# Patient Record
Sex: Female | Born: 1985 | Race: Black or African American | Hispanic: No | Smoking: Never smoker
Health system: Southern US, Community
[De-identification: ages and names within clinical notes are randomized; demographics above are authoritative.]

## PROBLEM LIST (undated history)

## (undated) DIAGNOSIS — Z87442 Personal history of urinary calculi: Secondary | ICD-10-CM

## (undated) DIAGNOSIS — F419 Anxiety disorder, unspecified: Secondary | ICD-10-CM

## (undated) DIAGNOSIS — J45909 Unspecified asthma, uncomplicated: Secondary | ICD-10-CM

## (undated) DIAGNOSIS — R519 Headache, unspecified: Secondary | ICD-10-CM

## (undated) DIAGNOSIS — F410 Panic disorder [episodic paroxysmal anxiety] without agoraphobia: Secondary | ICD-10-CM

## (undated) DIAGNOSIS — R51 Headache: Secondary | ICD-10-CM

## (undated) DIAGNOSIS — G40909 Epilepsy, unspecified, not intractable, without status epilepticus: Secondary | ICD-10-CM

## (undated) DIAGNOSIS — Z86718 Personal history of other venous thrombosis and embolism: Secondary | ICD-10-CM

## (undated) HISTORY — DX: Epilepsy, unspecified, not intractable, without status epilepticus: G40.909

## (undated) HISTORY — DX: Anxiety disorder, unspecified: F41.9

## (undated) HISTORY — DX: Panic disorder (episodic paroxysmal anxiety): F41.0

## (undated) HISTORY — PX: TONSILLECTOMY: SUR1361

## (undated) HISTORY — PX: APPENDECTOMY: SHX54

---
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
## 2007-04-11 ENCOUNTER — Emergency Department (HOSPITAL_COMMUNITY): Admission: EM | Admit: 2007-04-11 | Discharge: 2007-04-11 | Payer: Self-pay | Admitting: Family Medicine

## 2007-07-11 ENCOUNTER — Inpatient Hospital Stay (HOSPITAL_COMMUNITY): Admission: AD | Admit: 2007-07-11 | Discharge: 2007-07-11 | Payer: Self-pay | Admitting: Obstetrics and Gynecology

## 2008-01-04 ENCOUNTER — Inpatient Hospital Stay (HOSPITAL_COMMUNITY): Admission: AD | Admit: 2008-01-04 | Discharge: 2008-01-24 | Payer: Self-pay | Admitting: Obstetrics and Gynecology

## 2008-01-21 ENCOUNTER — Encounter (INDEPENDENT_AMBULATORY_CARE_PROVIDER_SITE_OTHER): Payer: Self-pay | Admitting: Obstetrics & Gynecology

## 2008-09-13 ENCOUNTER — Emergency Department (HOSPITAL_COMMUNITY): Admission: EM | Admit: 2008-09-13 | Discharge: 2008-09-13 | Payer: Self-pay | Admitting: Emergency Medicine

## 2010-04-04 ENCOUNTER — Observation Stay (HOSPITAL_COMMUNITY)
Admission: EM | Admit: 2010-04-04 | Discharge: 2010-04-04 | Payer: Self-pay | Source: Home / Self Care | Admitting: Emergency Medicine

## 2010-08-08 ENCOUNTER — Emergency Department (HOSPITAL_COMMUNITY)
Admission: EM | Admit: 2010-08-08 | Discharge: 2010-08-08 | Payer: Self-pay | Source: Home / Self Care | Admitting: Family Medicine

## 2010-09-25 LAB — COMPREHENSIVE METABOLIC PANEL
ALT: 8 U/L (ref 0–35)
AST: 16 U/L (ref 0–37)
Albumin: 4.1 g/dL (ref 3.5–5.2)
Alkaline Phosphatase: 30 U/L — ABNORMAL LOW (ref 39–117)
BUN: 9 mg/dL (ref 6–23)
CO2: 23 mEq/L (ref 19–32)
Calcium: 8.7 mg/dL (ref 8.4–10.5)
Chloride: 112 mEq/L (ref 96–112)
Creatinine, Ser: 0.53 mg/dL (ref 0.4–1.2)
GFR calc Af Amer: >60 mL/min (ref >60)
GFR calc non Af Amer: >60 mL/min (ref >60)
Glucose, Bld: 103 mg/dL — ABNORMAL HIGH (ref 70–99)
Potassium: 3.6 mEq/L (ref 3.5–5.1)
Sodium: 139 mEq/L (ref 135–145)
Total Bilirubin: 0.4 mg/dL (ref 0.3–1.2)
Total Protein: 6.7 g/dL (ref 6.0–8.3)

## 2010-09-25 LAB — URINALYSIS, ROUTINE W REFLEX MICROSCOPIC
Bilirubin Urine: NEGATIVE
Glucose, UA: NEGATIVE mg/dL
Hgb urine dipstick: NEGATIVE
Ketones, ur: NEGATIVE mg/dL
Nitrite: NEGATIVE
Protein, ur: NEGATIVE mg/dL
Specific Gravity, Urine: 1.028 (ref 1.005–1.030)
Urobilinogen, UA: 0.2 mg/dL (ref 0.0–1.0)
pH: 6 (ref 5.0–8.0)

## 2010-09-25 LAB — WET PREP, GENITAL
Clue Cells Wet Prep HPF POC: NONE SEEN
Trich, Wet Prep: NONE SEEN
Yeast Wet Prep HPF POC: NONE SEEN

## 2010-09-25 LAB — GC/CHLAMYDIA PROBE AMP, GENITAL
Chlamydia, DNA Probe: NEGATIVE
GC Probe Amp, Genital: NEGATIVE

## 2010-09-25 LAB — CBC
HCT: 34.8 % — ABNORMAL LOW (ref 36.0–46.0)
Hemoglobin: 11.6 g/dL — ABNORMAL LOW (ref 12.0–15.0)
MCH: 27.4 pg (ref 26.0–34.0)
MCHC: 33.4 g/dL (ref 30.0–36.0)
MCV: 82.2 fL (ref 78.0–100.0)
Platelets: 169 10*3/uL (ref 150–400)
RBC: 4.23 MIL/uL (ref 3.87–5.11)
RDW: 13.3 % (ref 11.5–15.5)
WBC: 6.2 10*3/uL (ref 4.0–10.5)

## 2010-09-25 LAB — DIFFERENTIAL
Basophils Absolute: 0 10*3/uL (ref 0.0–0.1)
Basophils Relative: 0 % (ref 0–1)
Eosinophils Absolute: 0 10*3/uL (ref 0.0–0.7)
Eosinophils Relative: 1 % (ref 0–5)
Lymphocytes Relative: 26 % (ref 12–46)
Lymphs Abs: 1.6 10*3/uL (ref 0.7–4.0)
Monocytes Absolute: 0.4 10*3/uL (ref 0.1–1.0)
Monocytes Relative: 7 % (ref 3–12)
Neutro Abs: 4.1 10*3/uL (ref 1.7–7.7)
Neutrophils Relative %: 66 % (ref 43–77)

## 2010-09-25 LAB — POCT PREGNANCY, URINE: Preg Test, Ur: NEGATIVE

## 2010-10-23 LAB — URINALYSIS, ROUTINE W REFLEX MICROSCOPIC
Bilirubin Urine: NEGATIVE
Glucose, UA: NEGATIVE mg/dL
Hgb urine dipstick: NEGATIVE
Ketones, ur: >80 mg/dL — AB
Nitrite: NEGATIVE
Protein, ur: NEGATIVE mg/dL
Specific Gravity, Urine: 1.031 — ABNORMAL HIGH (ref 1.005–1.030)
Urobilinogen, UA: 1 mg/dL (ref 0.0–1.0)
pH: 6 (ref 5.0–8.0)

## 2010-10-23 LAB — POCT PREGNANCY, URINE: Preg Test, Ur: NEGATIVE

## 2010-10-23 LAB — GC/CHLAMYDIA PROBE AMP, GENITAL
Chlamydia, DNA Probe: NEGATIVE
GC Probe Amp, Genital: NEGATIVE

## 2010-10-23 LAB — WET PREP, GENITAL
Clue Cells Wet Prep HPF POC: NONE SEEN
Trich, Wet Prep: NONE SEEN
WBC, Wet Prep HPF POC: NONE SEEN
Yeast Wet Prep HPF POC: NONE SEEN

## 2010-11-25 NOTE — Op Note (Signed)
Candace Stephens, Candace Stephens                 ACCOUNT NO.:  [id number]   MEDICAL RECORD NO.:  [id number]           PATIENT TYPE:   LOCATION:                                 FACILITY:   PHYSICIAN:  Gerrit Friends. Aldona Bar, M.D.   DATE OF BIRTH:  Apr 17, 1986   DATE OF PROCEDURE:  01/21/2008  DATE OF DISCHARGE:                               OPERATIVE REPORT   PREOPERATIVE DIAGNOSES:  1. A 33-1/2 weeks' intrauterine pregnancy.  2. Premature preterm rupture of membranes on January 04, 2008.  3. Labor with probable abruption.   POSTOPERATIVE DIAGNOSES:  1. A 33-1/2 weeks' intrauterine pregnancy.  2. Premature preterm rupture of membranes on January 04, 2008.  3. Labor with probable abruption.  4. Delivery of 4-pound 8-1/2-ounce female infant, Apgars 2, 4, and 7.      Arterial cord pH 7.21.   PROCEDURE:  Primary low transverse cesarean section.   SURGEON:  Gerrit Friends. Aldona Bar, MD   ANESTHESIA:  Epidural - progressed to general endotracheal anesthesia.   HISTORY:  This is a 25 year old female, a primigravida, with a due date  of March 07, 2008, presented at about 42 weeks' gestation with  premature preterm rupture of membranes.  She was admitted, covered with  antibiotics until strep culture returned as negative, received  betamethasone, and was washed expectantly and did very well with normal  white counts and no contractions until January 20, 2008, when she began  having some irregular contractions, which progressed into what was felt  to be early labor.  She was transported to Labor and Delivery on the  evening of January 20, 2008, and as her labor progressed, requested to  receive an epidural.  Fetal tracing was reassuring, although there were  some variable decelerations probably secondary to cord compression  associated with her decreased amniotic fluid volume.  Her cervix on  arrival on Labor and Delivery was about 3 cm with a vertex at about -2  to -3 station and cervix was about 80-90% effaced.  She progressed  to  about 4-5 cm of dilatation at about 5 a.m. on January 21, 2008, with a  vertex remaining at -2 to -3 station.  Fetal heart rate was reassuring.  An IUPC was placed for amnioinfusion if necessary and low-dose Pitocin  was begun to facilitate contractions.  I was called at 6:13 a.m. and  told the patient has started having a large amount of bright red  bleeding, and her cervix was about 5 cm dilated.  Being in the hospital,  I proceeded to her room where at 6:15 on my exam, she was 5 cm dilated  and possibly having an abruption.  She was taken right to the operating  room and prepared for immediate cesarean delivery.   In the operating room with a Foley catheter already inserted and the  epidural already augmented, she was prepped and draped in usual fashion.  It seemed that the epidural was going to be adequate, but upon making a  skin incision, it became obvious that the epidural was not adequate for  anesthesia.  Decision  was made to proceed with general endotracheal  anesthesia, which was carried out without difficulty.   Once the patient was adequately anesthetized, cesarean section was  continued.  The fascia was incised in a low transverse fashion.  The  skin and subcutaneous tissue had already been opened.  Subfascial space  was created inferiorly and superiorly and muscles were separated in the  midline and peritoneum was identified and they were appropriate with  care taken to avoid the bowel superiorly and the bladder inferiorly.  At  this time, the vesicouterine peritoneum was incised in a low transverse  fashion and pushed off the lower uterine segment with ease.  Sharp  incision into the uterus in a low transverse fashion was then carried  out with Metzenbaum scissors and upon entering the uterine cavity, a  large clot was produced.  The incision was extended laterally and  thereafter with minimal difficulty, delivery of a viable female infant was  delivered from a vertex  position.  After the cord was clamped and cut,  the infant was passed off the awaiting team.  Arterial cord pH was  collected and subsequently returned to the 7.21.  Placenta was  delivered.  There was no obvious old abruption, but the placenta was  sent to pathology appropriately.  At this time, the uterus was  exteriorized, rendered free of any remaining products of conception, and  with good uterine contractility afforded.  She was slowly given  intravenous Pitocin and manual stimulation.  The uterine incision was  closed using a single layer of #1 Vicryl in a running locking fashion.  Hemostasis was adequate.  Tubes and ovaries appeared normal.  At this  time, the abdomen lavaged of all free blood clot and with no foreign  bodies noted to be remained in the abdominal cavity, the uterus was  replaced into the abdominal cavity; and after all count was noted to be  correct, closure of the abdomen was carried out in layers.  The  abdominal peritoneum was closed with 0 Vicryl in a running fashion and  muscle was secured with same.  Subfascial space was rendered hemostatic.  The fascia was reapproximated using 0 Vicryl from angle to midline  bilaterally.  Subcutaneous tissue was rendered hemostatic.  Staples were  then used to close the skin.  A sterile pressure dressing was applied,  and the patient was transported to recovery in satisfactory condition  having tolerated the procedure well.   Baby was cared for by Dr. Ruben Gottron.  Baby required intubation and  was transported to the Neonatal Intensive Care Unit.  Apgar were  assigned at 2, 4, and 7.  Infant weighed 4 pounds 8-1/2 ounces.  As  mentioned, arterial cord pH at delivery was 7.21.   In summary, this patient who had preterm premature rupture of membranes  at 31 weeks progressed into early labor and during her labor, was felt  to have an abruption.  She was taken to the operating room for stat  cesarean section delivery and was  delivered a 4-pound 8-1/2-ounce female  infant with Apgars of 2, 4, and 7.  At the conclusion of the procedure,  both mother and baby were stable in their respective recovery areas.      Gerrit Friends. Aldona Bar, M.D.  Electronically Signed     RMW/MEDQ  D:  01/21/2008  T:  01/21/2008  Job:  [id number]

## 2010-11-26 ENCOUNTER — Inpatient Hospital Stay (INDEPENDENT_AMBULATORY_CARE_PROVIDER_SITE_OTHER)
Admission: RE | Admit: 2010-11-26 | Discharge: 2010-11-26 | Disposition: A | Discharge status: Home / Self Care | Payer: 59 | Source: Ambulatory Visit | Attending: Family Medicine | Admitting: Family Medicine

## 2010-11-26 ENCOUNTER — Ambulatory Visit (INDEPENDENT_AMBULATORY_CARE_PROVIDER_SITE_OTHER): Payer: 59

## 2010-11-26 DIAGNOSIS — J40 Bronchitis, not specified as acute or chronic: Secondary | ICD-10-CM

## 2010-11-28 NOTE — Discharge Summary (Signed)
Candace Stephens, SOWLE                 ACCOUNT NO.:  [id number]   MEDICAL RECORD NO.:  [id number]          PATIENT TYPE:  INP   LOCATION:  9306                          FACILITY:  [facility]   PHYSICIAN:  Carrington Clamp, M.D. DATE OF BIRTH:  08-Jan-1986   DATE OF ADMISSION:  01/04/2008  DATE OF DISCHARGE:  01/24/2008                               DISCHARGE SUMMARY   FINAL DIAGNOSES:  Intrauterine pregnancy at 33-1/2 weeks' gestation,  preterm premature rupture of membranes on January 04, 2008, active labor  with probable abruption, delivery of a female infant with Apgars of 2, 4,  and 7.   PROCEDURE:  Primary low transverse cesarean section.   SURGEON:  Gerrit Friends. Aldona Bar, MD.   COMPLICATIONS:  None.   This 25 year old G1, P0, presents about 44 weeks' gestation with preterm  premature rupture of membrane.  The patient was admitted, started on  antibiotics.  Group B strep culture was obtained, which returned  negative.  The patient did receive betamethasone per protocol.  The  patient was continued to be watched closely, and the hospital lab work  remained normal.  The patient did not have any fever, was not having any  contractions until December 21, 2007.  The patient began having some  irregular contractions.  The patient was taken to labor and delivery, as  her labor progressed and epidural was placed.  Fetal heart tracings were  reassuring even though variable decelerations were noted.  The patient  progressed about 4 to 5 cm of dilation and IUPCs were placed.  About  6:13 morning, Dr. Aldona Bar was called.  The patient was having a large  amount of bright red bleeding.  Cervix was only 5 cm dilated.  Dr. Aldona Bar  was already in the hospital.  The possible abruption was diagnosed, and  the patient was taken immediately for cesarean section.  The patient was  taken to the operating room on January 17, 2008, by Dr. Annamaria Helling where a  primary low transverse cesarean section was performed with a delivery of  4  pounds 8-1/2 ounce female infant with Apgars of 2, 4, and 7.  Arterial  cord pH of 7.21 was noted.  The patient and baby were both stable after  delivery.  The patient's postoperative course was benign without any  significant fevers.  The baby was in the NICU and stable.  The patient  was felt ready for discharge on postoperative day #3.  She was sent home  on a regular diet, told to decrease activities, told to continue her  vitamins, was given Percocet 1 to 2 every 4 to 6 hours as needed for her  pain, was to follow up in our office in 4 weeks.  Instructions and  precautions were reviewed with the patient.  Labs on discharge, the  patient had a hemoglobin of 10.4, white blood cell count of 7.1, and  platelets of 129,000.      Leilani Able, P.A.-C.      Carrington Clamp, M.D.  Electronically Signed    MB/MEDQ  D:  02/21/2008  T:  02/22/2008  Job:  [id number]

## 2010-11-28 NOTE — Op Note (Signed)
[place]  Patient:    Candace Stephens, Candace Stephens Visit Number: [id number] MRN: [id number]          Service Type: DSU Location: [facility] Attending Physician:  Lucky Cowboy Dictated by:   Lucky Cowboy, M.D. Proc. Date: 06/24/01 Admit Date:  06/24/2001   CC:         [facility]  Merita Norton, M.D. at [facility]   Operative Report  PREOPERATIVE DIAGNOSES: 1. Obstructive sleep apnea. 2. Chronic tonsillitis.  POSTOPERATIVE DIAGNOSES: 1. Obstructive sleep apnea. 2. Chronic tonsillitis.  PROCEDURE:  Adenotonsillectomy.  SURGEON:  Lucky Cowboy, M.D.  ANESTHESIA:  General endotracheal anesthesia.  ESTIMATED BLOOD LOSS:  30 cc.  SPECIMENS:  Tonsils and adenoids.  COMPLICATIONS:  None.  INDICATIONS FOR PROCEDURE:  This patient is a 25 year old female with weekly sore throats.  In addition, there has been chronic mouth breathing.  The mother reports that she snores very heavily at night with some struggling to breathe.  For these reasons, adenotonsillectomy is performed.  FINDINGS:  The patient was noted to have adenoid hypertrophy filling approximately 3/4 or 75% of the nasopharynx.  This was associated with overall exudate.  There was moderate intranasal edema as well.  Both of the tonsils were 3+ and very cryptic.  There was no active infection of the tonsils.  DESCRIPTION OF PROCEDURE:  The patient was taken to the operating room and placed on the table in the supine position.  She was then placed under general endotracheal anesthesia and the table rotated counterclockwise 90 degrees. The neck was then gently extended using a shoulder roll.  A Crowe-Davis mouth gag with a #3 tongue blade was then placed intraorally, opened and suspended on the Mayo stand.  Palpation of the soft palate was without evidence of a submucosal cleft.  The red rubber catheter was placed down the right nostril, brought out through the oral cavity, and  secured in place with a Hemostat. Inspection of the nasopharynx was then performed.  A medium sized adenoid curette was placed against the vomer and directed inferiorly, thus severing the majority of the adenoid pad.  The remainder was removed using subsequent passes and Thompson-St.Clair forceps.  Three sterile gauze Afrin soaked packs were placed in the nasopharynx, and time allowed for hemostasis.  Palate was relaxed, and both of the palatine tonsils removed using the harmonic scalpel staying in the peritonsillar space adjacent to the tonsillar capsule.  No bleeding was encountered.  Palate was then re-elevated and suction cautery used for hemostasis.  The palate was relaxed, and the nasopharynx copiously irrigated transnasally with normal saline which was suctioned out through the oral cavity.  An NG tube was then placed down the esophagus for suctioning of the gastric contents.  Mouthgag was removed, noted no damage to the teeth or soft tissues.  Table was rotated clockwise 90 degrees to its original position.  The patient was awakened from anesthesia and taken to the post anesthesia care unit in stable condition.  There were no complications.  She will be observed in the recovery care center, and may stay overnight depending on progress.  FOLLOWUP:  Return appointment is with Dr. Gerilyn Pilgrim in 3 weeks. Dictated by:   Lucky Cowboy, M.D. Attending Physician:  Lucky Cowboy DD:  06/24/01 TD:  06/24/01 Job: [id number]

## 2010-12-10 ENCOUNTER — Inpatient Hospital Stay (INDEPENDENT_AMBULATORY_CARE_PROVIDER_SITE_OTHER)
Admission: RE | Admit: 2010-12-10 | Discharge: 2010-12-10 | Disposition: A | Discharge status: Home / Self Care | Payer: 59 | Source: Ambulatory Visit | Attending: Family Medicine | Admitting: Family Medicine

## 2010-12-10 DIAGNOSIS — J310 Chronic rhinitis: Secondary | ICD-10-CM

## 2010-12-10 DIAGNOSIS — R05 Cough: Secondary | ICD-10-CM

## 2010-12-10 DIAGNOSIS — R059 Cough, unspecified: Secondary | ICD-10-CM

## 2011-04-09 LAB — STREP B DNA PROBE: Strep Group B Ag: NEGATIVE

## 2011-04-09 LAB — COMPREHENSIVE METABOLIC PANEL
BUN: 9
CO2: 23
Chloride: 108
Creatinine, Ser: 0.43
GFR calc non Af Amer: >60
Total Bilirubin: 0.5

## 2011-04-09 LAB — CBC
HCT: 32.1 — ABNORMAL LOW
HCT: 31.9 — ABNORMAL LOW
HCT: 32.3 — ABNORMAL LOW
Hemoglobin: 10.7 — ABNORMAL LOW
Hemoglobin: 10.9 — ABNORMAL LOW
Hemoglobin: 10.8 — ABNORMAL LOW
Hemoglobin: 10.9 — ABNORMAL LOW
Hemoglobin: 12.1
MCHC: 33.6
MCHC: 33.8
MCHC: 33.9
MCHC: 33.5
MCHC: 34
MCV: 82.6
MCV: 83.1
MCV: 82
MCV: 82.5
MCV: 83.2
Platelets: 169
Platelets: 170
Platelets: 129 — ABNORMAL LOW
Platelets: 148 — ABNORMAL LOW
Platelets: 163
Platelets: 169
RBC: 3.89
RBC: 4.16
RBC: 3.75 — ABNORMAL LOW
RBC: 3.86 — ABNORMAL LOW
RDW: 13.8
RDW: 13.8
RDW: 14
RDW: 14.2
WBC: 7.1
WBC: 7.3
WBC: 6.2
WBC: 7.1
WBC: 7.4

## 2011-04-09 LAB — RPR: RPR Ser Ql: NONREACTIVE

## 2011-04-17 LAB — WET PREP, GENITAL: Clue Cells Wet Prep HPF POC: NONE SEEN

## 2011-04-17 LAB — POCT PREGNANCY, URINE: Preg Test, Ur: POSITIVE

## 2011-04-17 LAB — URINALYSIS, ROUTINE W REFLEX MICROSCOPIC
Glucose, UA: NEGATIVE
Ketones, ur: NEGATIVE
Nitrite: NEGATIVE
Specific Gravity, Urine: 1.02
pH: 7

## 2011-04-17 LAB — GC/CHLAMYDIA PROBE AMP, GENITAL
Chlamydia, DNA Probe: NEGATIVE
GC Probe Amp, Genital: NEGATIVE

## 2016-02-02 ENCOUNTER — Encounter (HOSPITAL_BASED_OUTPATIENT_CLINIC_OR_DEPARTMENT_OTHER): Payer: Self-pay | Admitting: *Deleted

## 2016-02-02 ENCOUNTER — Emergency Department (HOSPITAL_BASED_OUTPATIENT_CLINIC_OR_DEPARTMENT_OTHER)
Admission: EM | Admit: 2016-02-02 | Discharge: 2016-02-02 | Disposition: A | Discharge status: Home / Self Care | Payer: BC Managed Care – PPO | Attending: Emergency Medicine | Admitting: Emergency Medicine

## 2016-02-02 ENCOUNTER — Emergency Department (HOSPITAL_BASED_OUTPATIENT_CLINIC_OR_DEPARTMENT_OTHER): Payer: BC Managed Care – PPO

## 2016-02-02 DIAGNOSIS — Y92410 Unspecified street and highway as the place of occurrence of the external cause: Secondary | ICD-10-CM | POA: Diagnosis not present

## 2016-02-02 DIAGNOSIS — Z79899 Other long term (current) drug therapy: Secondary | ICD-10-CM | POA: Insufficient documentation

## 2016-02-02 DIAGNOSIS — M25522 Pain in left elbow: Secondary | ICD-10-CM | POA: Diagnosis present

## 2016-02-02 DIAGNOSIS — Y999 Unspecified external cause status: Secondary | ICD-10-CM | POA: Diagnosis not present

## 2016-02-02 DIAGNOSIS — Y9389 Activity, other specified: Secondary | ICD-10-CM | POA: Insufficient documentation

## 2016-02-02 DIAGNOSIS — J45909 Unspecified asthma, uncomplicated: Secondary | ICD-10-CM | POA: Diagnosis not present

## 2016-02-02 DIAGNOSIS — G589 Mononeuropathy, unspecified: Secondary | ICD-10-CM | POA: Diagnosis not present

## 2016-02-02 HISTORY — DX: Unspecified asthma, uncomplicated: J45.909

## 2016-02-02 MED ORDER — GABAPENTIN 100 MG PO CAPS: 100.0000 mg | ORAL_CAPSULE | Freq: Three times a day (TID) | ORAL | 0 refills | Status: DC
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
## 2016-02-12 ENCOUNTER — Encounter: Payer: Self-pay | Admitting: Neurology

## 2016-02-17 DIAGNOSIS — M549 Dorsalgia, unspecified: Secondary | ICD-10-CM | POA: Insufficient documentation

## 2016-02-17 DIAGNOSIS — R29898 Other symptoms and signs involving the musculoskeletal system: Secondary | ICD-10-CM | POA: Insufficient documentation

## 2016-02-25 ENCOUNTER — Ambulatory Visit (INDEPENDENT_AMBULATORY_CARE_PROVIDER_SITE_OTHER): Payer: BC Managed Care – PPO | Admitting: Neurology

## 2016-02-25 ENCOUNTER — Encounter: Payer: Self-pay | Admitting: Neurology

## 2016-02-25 ENCOUNTER — Encounter: Payer: Self-pay | Admitting: *Deleted

## 2016-02-25 VITALS — BP 130/80 | HR 93 | Ht 63.5 in | Wt 136.1 lb

## 2016-02-25 DIAGNOSIS — G5622 Lesion of ulnar nerve, left upper limb: Secondary | ICD-10-CM | POA: Diagnosis not present

## 2016-02-25 DIAGNOSIS — M5410 Radiculopathy, site unspecified: Secondary | ICD-10-CM | POA: Insufficient documentation

## 2016-02-25 MED ORDER — CYCLOBENZAPRINE HCL 5 MG PO TABS: 5.0000 mg | ORAL_TABLET | Freq: Every evening | ORAL | 3 refills | Status: DC | PRN

## 2016-02-25 MED ORDER — GABAPENTIN 300 MG PO CAPS: 300.0000 mg | ORAL_CAPSULE | Freq: Every day | ORAL | 5 refills | Status: DC

## 2016-02-25 NOTE — Patient Instructions (Addendum)
1.  Start physical therapy for right leg stretching 2.  Start gabapentin 300mg  at bedtime 3.  Start flexeril 5mg  at bedtime 4.  Work excuse provided until March 13, 2016  Return to clinic in 3 months
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
## 2016-02-27 ENCOUNTER — Ambulatory Visit: Payer: No Typology Code available for payment source | Attending: Neurology | Admitting: Physical Therapy

## 2016-02-27 DIAGNOSIS — M5441 Lumbago with sciatica, right side: Secondary | ICD-10-CM | POA: Insufficient documentation

## 2016-02-27 DIAGNOSIS — M62830 Muscle spasm of back: Secondary | ICD-10-CM

## 2016-02-27 DIAGNOSIS — R262 Difficulty in walking, not elsewhere classified: Secondary | ICD-10-CM | POA: Diagnosis present

## 2016-02-27 NOTE — Patient Instructions (Signed)
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
## 2016-03-04 ENCOUNTER — Ambulatory Visit: Payer: No Typology Code available for payment source

## 2016-03-04 DIAGNOSIS — M62830 Muscle spasm of back: Secondary | ICD-10-CM

## 2016-03-04 DIAGNOSIS — M5441 Lumbago with sciatica, right side: Secondary | ICD-10-CM | POA: Diagnosis not present

## 2016-03-04 DIAGNOSIS — R262 Difficulty in walking, not elsewhere classified: Secondary | ICD-10-CM

## 2016-03-04 NOTE — Therapy (Signed)
[facility] [address]  [address] [place], [place], [address] Phone: [phone]   Fax:  [phone]  Physical Therapy Treatment  Patient Details  Name: Candace Stephens Fratto MRN: [id number] Date of Birth: 02-Mar-1986 Referring Provider: Glendale Chardonika K Patel, DO  Encounter Date: 03/04/2016      PT End of Session - 03/04/16 0903    Visit Number 2   Number of Visits 12   Date for PT Re-Evaluation 04/09/16   PT Start Time 0845   PT Stop Time 0930   PT Time Calculation (min) 45 min   Activity Tolerance Patient tolerated treatment well;Patient limited by pain   Behavior During Therapy [facility] for tasks assessed/performed      Past Medical History:  Diagnosis Date  . Asthma     Past Surgical History:  Procedure Laterality Date  . APPENDECTOMY    . CESAREAN SECTION    . TONSILLECTOMY      There were no vitals filed for this visit.      Subjective Assessment - 03/04/16 0854    Subjective Pt. reporting the last few days have been very painful making it difficult for her to do much of anything.  Pt. with 5/10 initial R buttocks pain.     Patient Stated Goals "To be able to be back to normal - tolerate standing and walking for longer period of time"   Currently in Pain? Yes   Pain Score 5    Pain Location Back   Pain Orientation Right;Lower   Pain Descriptors / Indicators Crushing;Tingling;Aching;Sharp   Pain Type Acute pain   Pain Radiating Towards pain in R    Pain Onset 1 to 4 weeks ago   Pain Frequency Constant   Multiple Pain Sites No        Today's treatment:  Therex: NuStep: level 4, 5 min  Hooklying pelvic tilt with abdominal bracing 5" x 10 reps  Hooklying bridge (limited ROM) x 10 reps  Manual: R HS, glute, piriformis, SKTC stretch x 30 sec; pt. very guarded with these R hip flexor stretch (in mod thomas position) attempted however terminated due to R buttocks pain L sidelying R glute foam roller; pt. with very  limited tolerance for this; very TTP over TFL  L sidelying R hip flexor stretch x 3 min with strumming to RF; pain increased to 6/10 in this position with significant muscle guarding; pt. only reporting hip flexor stretch at 2 min mark following report of pain in R lower back  Modalities: Moist heat to B hip/buttocks: prone lying with pillow under hips, 10 min   * pt. reports the R hip/buttocks pain at a 1/10 following moist heat          PT Long Term Goals - 03/04/16 0934      PT LONG TERM GOAL #1   Title Indpendent with HEP by 04/09/16   Status On-going     PT LONG TERM GOAL #2   Title Lumbar flexibility WNL w/o LBP or R LE radiculopathy by 04/09/16   Status On-going     PT LONG TERM GOAL #3   Title B hip strength >/= 4+/5 w/o pain by 04/09/16   Status On-going     PT LONG TERM GOAL #4   Title Pt will be able to perform normal household chores and job tasks w/o limitation due to R low back/hip or R LE radicular pain by 04/09/16   Status On-going  Plan - 03/04/16 0933    Clinical Impression Statement Pt. reporting the last few days have been very painful making it difficult for her to do much of anything.  Pt. with 5/10 initial R buttocks pain.  Pt. very guarded throughtout therex with LE stretching and positioning.  Pt. with very limited tolerance for STM to R hip/buttocks today; very TTP over TFL and piriformis.  Pt. able to perform bridge today with limited ROM without large pain increase.  Increased time taken with all hip/LE stretching due to muscular guarding.  Pt. ending treatment with 1/10 R hip pain following application of moist heat in prone.  No HEP activities issued to pt. today due to inability for pt. to perform LE stretching without 8/10 pain and guarding.  Pt. to see PT on 8/25.     PT Treatment/Interventions Patient/family education;Electrical Stimulation;Moist Heat;Ultrasound;Cryotherapy;Traction;Iontophoresis 4mg /ml Dexamethasone;Manual  techniques;Dry needling;Taping;Therapeutic exercise;Neuromuscular re-education;ADLs/Self Care Home Management   PT Next Visit Plan Manual therapy & modalities to reduce pain; Initiate lumbar/proximal LE flexibility stretches as tolerated; Core/LE strengthening as tolerated      Patient will benefit from skilled therapeutic intervention in order to improve the following deficits and impairments:  Pain, Increased muscle spasms, Impaired flexibility, Decreased strength, Decreased activity tolerance, Difficulty walking  Visit Diagnosis: Right-sided low back pain with right-sided sciatica  Muscle spasm of back  Difficulty in walking, not elsewhere classified     Problem List Patient Active Problem List   Diagnosis Date Noted  . Radicular syndrome of right leg 02/25/2016    Kermit BaloMicah Ameen Mostafa , PTA 03/04/2016, 10:39 AM  [facility] [address]  [address] [place], [place], [address] Phone: [phone]   Fax:  [phone]  Name: Candace Stephens Mcmeekin MRN: [id number] Date of Birth: 10-19-1985

## 2016-03-06 ENCOUNTER — Ambulatory Visit: Payer: No Typology Code available for payment source | Admitting: Physical Therapy

## 2016-03-06 ENCOUNTER — Telehealth: Payer: Self-pay | Admitting: Neurology

## 2016-03-06 DIAGNOSIS — M5441 Lumbago with sciatica, right side: Secondary | ICD-10-CM

## 2016-03-06 DIAGNOSIS — M62830 Muscle spasm of back: Secondary | ICD-10-CM

## 2016-03-06 DIAGNOSIS — R262 Difficulty in walking, not elsewhere classified: Secondary | ICD-10-CM

## 2016-03-06 NOTE — Telephone Encounter (Signed)
Patient needs to talk to someone about getting pain medication to help her during the day please call [phone]

## 2016-03-06 NOTE — Telephone Encounter (Signed)
Patient given instructions and agreed with plan.  

## 2016-03-06 NOTE — Telephone Encounter (Signed)
Increase gabapentin in AM to 300 mg from 100mg 

## 2016-03-06 NOTE — Therapy (Signed)
[facility] [address]  [place], [place], [address] Phone: [phone]   Fax:  [phone]  Physical Therapy Treatment  Patient Details  Name: Candace Stephens MRN: [id number] Date of Birth: 02-Sep-1985 Referring Provider: Alda Berthold, DO  Encounter Date: 03/06/2016      PT End of Session - 03/06/16 0848    Visit Number 3   Number of Visits 12   Date for PT Re-Evaluation 04/09/16   PT Start Time 0848   PT Stop Time 0936   PT Time Calculation (min) 48 min   Activity Tolerance Patient tolerated treatment well;Patient limited by pain   Behavior During Therapy [facility] for tasks assessed/performed      Past Medical History:  Diagnosis Date  . Asthma     Past Surgical History:  Procedure Laterality Date  . APPENDECTOMY    . CESAREAN SECTION    . TONSILLECTOMY      There were no vitals filed for this visit.      Subjective Assessment - 03/06/16 0848    Subjective Pt reports pain has been bad for the past few days, intereferring with all activity and limiting sleep. States did purchase TENS unit and has been using it home but limited relief reported.   Patient Stated Goals "To be able to be back to normal - tolerate standing and walking for longer period of time"   Currently in Pain? Yes   Pain Score 8    Pain Location Buttocks   Pain Orientation Right   Pain Descriptors / Indicators Sharp;Shooting;Stabbing   Pain Radiating Towards Throbbing pain down posterior leg to knee           Today's Treatment  Manual STM/MFR to R piriformis in L sidelying with leg supported on bolster (limited tolerance)  SIJ Assessment - reveals apparent R posterior innominate rotation of pelvis on sacrum    Performed MET to correct rotation followed by pelvic shotgun - No cavitation heard/felt, but alignment appeared neutralized after second repetition of MET  Kinesiotape  R piriformis - 2 "I" strips - 30% along muscle  with 50% perpendicular at area of greatest tenderness  Modalities Moist heat to B hip/buttocks -  prone lying with pillow under hips and feet/ankles elevated on bolster to promote muscle relaxation x 10 min  Pt declined estim, reporting having used TENS unit earlier this morning.          PT Education - 03/06/16 0931    Education provided Yes   Education Details SIJ assessment findings - role of MET & kinesiotaping   Person(s) Educated Patient   Methods Explanation;Demonstration   Comprehension Verbalized understanding             PT Long Term Goals - 03/04/16 0934      PT LONG TERM GOAL #1   Title Indpendent with HEP by 04/09/16   Status On-going     PT LONG TERM GOAL #2   Title Lumbar flexibility WNL w/o LBP or R LE radiculopathy by 04/09/16   Status On-going     PT LONG TERM GOAL #3   Title B hip strength >/= 4+/5 w/o pain by 04/09/16   Status On-going     PT LONG TERM GOAL #4   Title Pt will be able to perform normal household chores and job tasks w/o limitation due to R low back/hip or R LE radicular pain by 04/09/16   Status On-going  Plan - 03/06/16 0930    Clinical Impression Statement Pt remains very irritated with high pain levels and limited tolerance for PT or daily activities, and having difficulty sleeping due to unable to find comfortable position. Completed SIJ assessment with apparent R posterior innominate rotation noted. MET performed to correct alignment with no cavitation noted but alignment neutralized after second repetition of MET. Kinesiotape applied to R piriformis to promote muscle inhibition/relaxation and improve circluation and will assess response at next visit. Session completed with moist heat to low back/buttocks. Pt reporting pain reduced to 3-4/10 by end of session.   PT Treatment/Interventions Patient/family education;Electrical Stimulation;Moist Heat;Ultrasound;Cryotherapy;Traction;Iontophoresis 80m/ml  Dexamethasone;Manual techniques;Dry needling;Taping;Therapeutic exercise;Neuromuscular re-education;ADLs/Self Care Home Management   PT Next Visit Plan Assess resposne to taping; Re-assess SIJ as indicated; Manual therapy & modalities to reduce pain; Initiate lumbar/proximal LE flexibility stretches as tolerated; Core/LE strengthening as tolerated   Consulted and Agree with Plan of Care Patient      Patient will benefit from skilled therapeutic intervention in order to improve the following deficits and impairments:  Pain, Increased muscle spasms, Impaired flexibility, Decreased strength, Decreased activity tolerance, Difficulty walking  Visit Diagnosis: Right-sided low back pain with right-sided sciatica  Muscle spasm of back  Difficulty in walking, not elsewhere classified     Problem List Patient Active Problem List   Diagnosis Date Noted  . 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

## 2016-03-06 NOTE — Telephone Encounter (Signed)
Patient is using the flexeril and gabapentin 300 mg qhs and 100 mg qam but she is still having a lot of pain in the mornings and after PT.  Please advise.

## 2016-03-10 ENCOUNTER — Ambulatory Visit: Payer: No Typology Code available for payment source | Admitting: Physical Therapy

## 2016-03-10 DIAGNOSIS — M5441 Lumbago with sciatica, right side: Secondary | ICD-10-CM | POA: Diagnosis not present

## 2016-03-10 DIAGNOSIS — M62830 Muscle spasm of back: Secondary | ICD-10-CM

## 2016-03-10 DIAGNOSIS — R262 Difficulty in walking, not elsewhere classified: Secondary | ICD-10-CM

## 2016-03-10 NOTE — Therapy (Signed)
[facility] [address]  [place], [place], [address] Phone: [phone]   Fax:  [phone]  Physical Therapy Treatment  Patient Details  Name: Candace Stephens MRN: [id number] Date of Birth: [birth year] Referring Provider: Alda Berthold, DO  Encounter Date: 03/10/2016      PT End of Session - 03/10/16 0934    Visit Number 4   Number of Visits 12   Date for PT Re-Evaluation 04/09/16   PT Start Time 0934   PT Stop Time 1016   PT Time Calculation (min) 42 min   Activity Tolerance Patient tolerated treatment well   Behavior During Therapy [facility] for tasks assessed/performed      Past Medical History:  Diagnosis Date  . Asthma     Past Surgical History:  Procedure Laterality Date  . APPENDECTOMY    . CESAREAN SECTION    . TONSILLECTOMY      There were no vitals filed for this visit.      Subjective Assessment - 03/10/16 0934    Subjective Pt reports pain got so bad over the weekend that she went to urgent care and was given an injection which has completely relievede her pain.   Patient Stated Goals "To be able to be back to normal - tolerate standing and walking for longer period of time"   Currently in Pain? No/denies           Today's Treatment  Manual SIJ Assessment - reveals apparent R posterior innominate rotation of pelvis on sacrum    Performed MET to correct rotation followed by pelvic shotgun - No cavitation heard/felt, but alignment appeared neutralized after 1st rep of MET  Manual B HS, ITB, KTOS & figure 4 piriformis stretches x30" each  TherEx B HS &  ITB stretches with strap 2x30" each B KTOS with opposite knee bent vs straight x30" each B figure 4 piriformis stretch 2x30" Mod thomas Hip flexor/RF stretch 2x30" Abd bracing 10x5" TrA + B Hip ABD with green TB 10x5" TrA + Bridge + B Hip ABD isometric with green TB 10x5" (limited lift)          PT Education - 03/10/16 1015     Education provided Yes   Education Details Initial stretching & core stabilization HEP   Person(s) Educated Patient   Methods Explanation;Demonstration;Handout   Comprehension Verbalized understanding;Returned demonstration             PT Long Term Goals - 03/04/16 0934      PT LONG TERM GOAL #1   Title Indpendent with HEP by 04/09/16   Status On-going     PT LONG TERM GOAL #2   Title Lumbar flexibility WNL w/o LBP or R LE radiculopathy by 04/09/16   Status On-going     PT LONG TERM GOAL #3   Title B hip strength >/= 4+/5 w/o pain by 04/09/16   Status On-going     PT LONG TERM GOAL #4   Title Pt will be able to perform normal household chores and job tasks w/o limitation due to R low back/hip or R LE radicular pain by 04/09/16   Status On-going               Plan - 03/10/16 1016    Clinical Impression Statement Pt ended up at urgent care over the weekend due to persistent high pain levels and received injection which has totally relieved her pain at present. Reassessment of  SIJ revealed return of R posterior innominate rotation which corrected easily with initial round of MET. Continued excessive tightness noted in proximal LE musculature R>L along with core weakness, therefore initial stretching and lumbar stabilization HEP created with pt able to perform all exercises w/o pain other than muscle pull. Pt noted muscle twitching while taping in place, but not sure of effect on pain due to effect of injection.   PT Treatment/Interventions Patient/family education;Electrical Stimulation;Moist Heat;Ultrasound;Cryotherapy;Traction;Iontophoresis 47m/ml Dexamethasone;Manual techniques;Dry needling;Taping;Therapeutic exercise;Neuromuscular re-education;ADLs/Self Care Home Management   PT Next Visit Plan Review initial HEP; Re-assess SIJ as indicated; Manual therapy & modalities to reduce pain; Initiate lumbar/proximal LE flexibility stretches as tolerated; Core/LE strengthening as  tolerated   Consulted and Agree with Plan of Care Patient      Patient will benefit from skilled therapeutic intervention in order to improve the following deficits and impairments:  Pain, Increased muscle spasms, Impaired flexibility, Decreased strength, Decreased activity tolerance, Difficulty walking  Visit Diagnosis: Right-sided low back pain with right-sided sciatica  Muscle spasm of back  Difficulty in walking, not elsewhere classified     Problem List Patient Active Problem List   Diagnosis Date Noted  . Radicular syndrome of right leg 02/25/2016    JPercival Spanish PT, MPT 03/10/2016, 12:18 PM  [facility] [place] [address]Phone: [phone]  Fax:  [phone] Name: TMAURIAH MCMILLENMRN: [id number] of Birth: 1Aug 09, 1987

## 2016-03-12 ENCOUNTER — Ambulatory Visit: Payer: No Typology Code available for payment source

## 2016-03-12 DIAGNOSIS — M5441 Lumbago with sciatica, right side: Secondary | ICD-10-CM

## 2016-03-12 DIAGNOSIS — R262 Difficulty in walking, not elsewhere classified: Secondary | ICD-10-CM

## 2016-03-12 DIAGNOSIS — M62830 Muscle spasm of back: Secondary | ICD-10-CM

## 2016-03-12 NOTE — Therapy (Signed)
[facility] [address]  [address] [place], [place], [address] Phone: [phone]   Fax:  [phone]  Physical Therapy Treatment  Patient Details  Name: Candace Stephens MRN: [id number] Date of Birth: 1985-11-18 Referring Provider: Glendale Chardonika K Patel, DO  Encounter Date: 03/12/2016      PT End of Session - 03/12/16 0849    Visit Number 5   Number of Visits 12   Date for PT Re-Evaluation 04/09/16   PT Start Time 0846   PT Stop Time 0928   PT Time Calculation (min) 42 min   Activity Tolerance Patient tolerated treatment well   Behavior [facility] Therapy [facility] for tasks assessed/performed      Past Medical History:  Diagnosis Date  . Asthma     Past Surgical History:  Procedure Laterality Date  . APPENDECTOMY    . CESAREAN SECTION    . TONSILLECTOMY      There were no vitals filed for this visit.      Subjective Assessment - 03/12/16 0848    Subjective Pt. reporting she returns to work tomorrow for a full shift and has had mild pain over the last few days however still with good relief from injection.     Patient Stated Goals "To be able to be back to normal - tolerate standing and walking for longer period of time"   Currently in Pain? No/denies   Pain Score 0-No pain   Multiple Pain Sites No        Today's treatment:  Therex: Recumbent bike: level 1, 4 min   HEP review: B HS stretch with strap x 30 sec each side  B ITB stretch with strap x 30 sec each side  B Supine modified piriformis x 30 sec each side  B Seated piriformis x 30 sec each side  Hooklying B hip abduction/ER with green TB 5" x 10 reps each side Hooklying bridge with sustained B hip abd/ER with green TB 5" x 10 reps Hooklying abdominal bracing 5" x 10 reps B hip flexor stretch x 30 sec each side   Therex: Hooklying B hip abd/ER with green TB x 10 reps          PT Long Term Goals - 03/04/16 0934      PT LONG TERM GOAL #1   Title  Indpendent with HEP by 04/09/16   Status On-going     PT LONG TERM GOAL #2   Title Lumbar flexibility WNL w/o LBP or R LE radiculopathy by 04/09/16   Status On-going     PT LONG TERM GOAL #3   Title B hip strength >/= 4+/5 w/o pain by 04/09/16   Status On-going     PT LONG TERM GOAL #4   Title Pt will be able to perform normal household chores and job tasks w/o limitation due to R low back/hip or R LE radicular pain by 04/09/16   Status On-going               Plan - 03/12/16 0850    Clinical Impression Statement  Pt. reports plan to return to work tomorrow and still with good relief following injection to hip; only mild pain initially today.  Pt. with good recall of HEP and able to demo good technique.  Pt. reporting she will be performing this HEP consistently twice a day.      PT Treatment/Interventions Patient/family education;Electrical Stimulation;Moist Heat;Ultrasound;Cryotherapy;Traction;Iontophoresis 4mg /ml Dexamethasone;Manual techniques;Dry needling;Taping;Therapeutic exercise;Neuromuscular re-education;ADLs/Self Care  Home Management   PT Next Visit Plan Re-assess SIJ as indicated; Manual therapy & modalities to reduce pain; Initiate lumbar/proximal LE flexibility stretches as tolerated; Core/LE strengthening as tolerated      Patient will benefit from skilled therapeutic intervention in order to improve the following deficits and impairments:  Pain, Increased muscle spasms, Impaired flexibility, Decreased strength, Decreased activity tolerance, Difficulty walking  Visit Diagnosis: Right-sided low back pain with right-sided sciatica  Muscle spasm of back  Difficulty in walking, not elsewhere classified     Problem List Patient Active Problem List   Diagnosis Date Noted  . 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  [address] [place], [place], [address] Phone: [phone]   Fax:  [phone]  Name: Candace Stephens MRN: [id number] Date of Birth: May 10, 1986

## 2016-03-17 ENCOUNTER — Ambulatory Visit: Payer: BC Managed Care – PPO | Attending: Neurology | Admitting: Physical Therapy

## 2016-03-17 DIAGNOSIS — M5441 Lumbago with sciatica, right side: Secondary | ICD-10-CM | POA: Diagnosis present

## 2016-03-17 DIAGNOSIS — R262 Difficulty in walking, not elsewhere classified: Secondary | ICD-10-CM | POA: Diagnosis present

## 2016-03-17 DIAGNOSIS — M62830 Muscle spasm of back: Secondary | ICD-10-CM | POA: Insufficient documentation

## 2016-03-17 NOTE — Therapy (Signed)
[facility] [address]  [place], [place], [address] Phone: [phone]   Fax:  [phone]  Physical Therapy Treatment  Patient Details  Name: Candace Stephens MRN: [id number] Date of Birth: [birth year] Referring Provider: Alda Berthold, DO  Encounter Date: 03/17/2016      PT End of Session - 03/17/16 1532    Visit Number 6   Number of Visits 12   Date for PT Re-Evaluation 04/09/16   PT Start Time 1532   PT Stop Time 1617   PT Time Calculation (min) 45 min   Activity Tolerance Patient tolerated treatment well   Behavior During Therapy [facility] for tasks assessed/performed      Past Medical History:  Diagnosis Date  . Asthma     Past Surgical History:  Procedure Laterality Date  . APPENDECTOMY    . CESAREAN SECTION    . TONSILLECTOMY      There were no vitals filed for this visit.      Subjective Assessment - 03/17/16 1532    Subjective Pt reports pain has been worsening over the past few days, limiting exercise and activity tolerance.   Patient Stated Goals "To be able to be back to normal - tolerate standing and walking for longer period of time"   Currently in Pain? Yes   Pain Score 5    Pain Location Back  & buttock   Pain Orientation Right   Pain Descriptors / Indicators Sharp;Stabbing         Today's Treatment  TherEx Rec bike - level 1 x 4'   Manual SIJ Assessment - reveals apparent R posterior innominate rotation of pelvis on sacrum Performed MET to correct rotation followed by pelvic shotgun - No cavitation heard/felt, but alignment appeared neutralized after 1st rep of MET  Manual B HS, ITB, KTOS & figure 4 piriformis stretches x30" each STM/DTM & IASTM/MFR with 4" ball to R piriformis in L sidelying  TherEx TrA + Bridge + B Isometric Hip ABD with belt 10x5" TrA + Bridge + B Isometric Hip ADD with small ball 10x5" 3 way Prayer stretch x30" each Cat/camel  10x5"  Kinesiotape  R piriformis - 2 "I" strips - 30% along muscle with 50% perpendicular at area of greatest tenderness R SIJ - 3 "I" strips in 50% star           PT Education - 03/17/16 1614    Education provided Yes   Education Details Self-release for piriformis using ~ 4" ball    Person(s) Educated Patient   Methods Explanation;Demonstration   Comprehension Verbalized understanding             PT Long Term Goals - 03/04/16 0934      PT LONG TERM GOAL #1   Title Indpendent with HEP by 04/09/16   Status On-going     PT LONG TERM GOAL #2   Title Lumbar flexibility WNL w/o LBP or R LE radiculopathy by 04/09/16   Status On-going     PT LONG TERM GOAL #3   Title B hip strength >/= 4+/5 w/o pain by 04/09/16   Status On-going     PT LONG TERM GOAL #4   Title Pt will be able to perform normal household chores and job tasks w/o limitation due to R low back/hip or R LE radicular pain by 04/09/16   Status On-going  Plan - 03/17/16 1617    Clinical Impression Statement Pt reports she has been back to work but noting pain has worsened over last several days. Reassessment of SIJ revealed return of posterior innominate rotation on L, but corrected with 1 round of MET. Kept exercise focus symmetrical to promote maintenance of neutral alignment and targeted manual therapy to pirformis to decrease muscular pull on SIJ, with kinesiotape applied to promote reduced pain and muscle spasm.   PT Treatment/Interventions Patient/family education;Electrical Stimulation;Moist Heat;Ultrasound;Cryotherapy;Traction;Iontophoresis 51m/ml Dexamethasone;Manual techniques;Dry needling;Taping;Therapeutic exercise;Neuromuscular re-education;ADLs/Self Care Home Management   PT Next Visit Plan Assess respone to taping; Re-assess SIJ as indicated; Manual therapy & modalities to reduce pain; Lumbar/proximal LE flexibility stretches as tolerated; Core/LE strengthening as tolerated    Consulted and Agree with Plan of Care Patient      Patient will benefit from skilled therapeutic intervention in order to improve the following deficits and impairments:  Pain, Increased muscle spasms, Impaired flexibility, Decreased strength, Decreased activity tolerance, Difficulty walking  Visit Diagnosis: Right-sided low back pain with right-sided sciatica  Muscle spasm of back  Difficulty in walking, not elsewhere classified     Problem List Patient Active Problem List   Diagnosis Date Noted  . Radicular syndrome of right leg 02/25/2016    JPercival Spanish PT, MPT 03/17/2016, 6:53 PM  [facility] [place] [address]Phone: [phone]  Fax:  [phone] Name: Candace MCCOWNMRN: [id number] of Birth: [birth year]

## 2016-03-19 ENCOUNTER — Encounter: Payer: Self-pay | Admitting: Rehabilitative and Restorative Service Providers"

## 2016-03-19 ENCOUNTER — Ambulatory Visit: Payer: BC Managed Care – PPO | Admitting: Rehabilitative and Restorative Service Providers"

## 2016-03-19 DIAGNOSIS — M5441 Lumbago with sciatica, right side: Secondary | ICD-10-CM | POA: Diagnosis not present

## 2016-03-19 DIAGNOSIS — M62830 Muscle spasm of back: Secondary | ICD-10-CM

## 2016-03-19 DIAGNOSIS — R262 Difficulty in walking, not elsewhere classified: Secondary | ICD-10-CM

## 2016-03-19 NOTE — Therapy (Signed)
[facility] [address]  [address] [place], [place], [address] Phone: [phone]   Fax:  [phone]  Physical Therapy Treatment  Patient Details  Name: MARVIS BAKKEN MRN: [id number] Date of Birth: 26-Jun-1986 Referring Provider: Glendale Chard, DO  Encounter Date: 03/19/2016      PT End of Session - 03/19/16 1606    Visit Number 7   Number of Visits 12   Date for PT Re-Evaluation 04/09/16   PT Start Time 1531   PT Stop Time 1620   PT Time Calculation (min) 49 min   Activity Tolerance Patient tolerated treatment well      Past Medical History:  Diagnosis Date  . Asthma     Past Surgical History:  Procedure Laterality Date  . APPENDECTOMY    . CESAREAN SECTION    . TONSILLECTOMY      There were no vitals filed for this visit.      Subjective Assessment - 03/19/16 1530    Subjective Pain in the Rt hip when she lies down to rest or when she is getting up in the morning. She is up and odwn on her feet most of the day. She is walking more and with the increased walking she has more hip pain. She is felling better today than she did Tuesday/    Currently in Pain? Yes   Pain Score 3    Pain Location Back   Pain Orientation Right   Pain Descriptors / Indicators Throbbing   Pain Type Acute pain   Pain Onset More than a month ago   Pain Frequency Constant                         [facility] Adult PT Treatment/Exercise - 03/19/16 0001      Self-Care   Self-Care --  education re-sitting posture; use of moist heat s/p TDN     Moist Heat Therapy   Number Minutes Moist Heat 15 Minutes   Moist Heat Location Lumbar Spine;Hip  Rt hip      Manual Therapy   Manual therapy comments pt prone    Soft tissue mobilization bilat lumbar paraspinals; Rt piriformis/glut max/glut med    Myofascial Release lumbar to Rt hip           Trigger Point Dry Needling - 03/19/16 1602    Consent Given? Yes   Education  Handout Provided Yes   Muscles Treated Upper Body --  glut med - decreased muscular tightness to palpation   Gluteus Maximus Response Twitch response elicited;Palpable increased muscle length   Gluteus Minimus Response Twitch response elicited;Palpable increased muscle length   Piriformis Response Twitch response elicited;Palpable increased muscle length              PT Education - 03/19/16 1606    Education provided Yes   Education Details TDN   Person(s) Educated Patient   Methods Explanation   Comprehension Verbalized understanding             PT Long Term Goals - 03/19/16 1610      PT LONG TERM GOAL #1   Title Indpendent with HEP by 04/09/16   Status On-going     PT LONG TERM GOAL #2   Title Lumbar flexibility WNL w/o LBP or R LE radiculopathy by 04/09/16   Status On-going     PT LONG TERM GOAL #3   Title B hip strength >/=  4+/5 w/o pain by 04/09/16   Status On-going     PT LONG TERM GOAL #4   Title Pt will be able to perform normal household chores and job tasks w/o limitation due to R low back/hip or R LE radicular pain by 04/09/16   Status On-going               Plan - 03/19/16 1607    Clinical Impression Statement Improvement reported with therapy and kinesotape. Tolerated TDN well with good release of muscular tightness to palpation. Primary therapist reapplied kinesotape.    Rehab Potential Good   PT Frequency 2x / week   PT Duration 6 weeks   PT Treatment/Interventions Patient/family education;Electrical Stimulation;Moist Heat;Ultrasound;Cryotherapy;Traction;Iontophoresis 4mg /ml Dexamethasone;Manual techniques;Dry needling;Taping;Therapeutic exercise;Neuromuscular re-education;ADLs/Self Care Home Management   PT Next Visit Plan Assess respone to TDN; Re-assess SIJ as indicated; Manual therapy & modalities to reduce pain; Lumbar/proximal LE flexibility stretches as tolerated; Core/LE strengthening as tolerated. continue kinesotaping to decrease pain  and muscular tightness   Consulted and Agree with Plan of Care Patient      Patient will benefit from skilled therapeutic intervention in order to improve the following deficits and impairments:  Pain, Increased muscle spasms, Impaired flexibility, Decreased strength, Decreased activity tolerance, Difficulty walking  Visit Diagnosis: Right-sided low back pain with right-sided sciatica  Muscle spasm of back  Difficulty in walking, not elsewhere classified     Problem List Patient Active Problem List   Diagnosis Date Noted  . Radicular syndrome of right leg 02/25/2016    Jessilynn Taft Rober MinionP Lyndsay Talamante PT, MPH  03/19/2016, 4:11 PM  [facility] [address]  [address] [place], [place], [address] Phone: [phone]   Fax:  [phone]  Name: Delanna Noticeamara N Lansky MRN: [id number] Date of Birth: January 20, 1986

## 2016-03-19 NOTE — Patient Instructions (Signed)

## 2016-03-23 ENCOUNTER — Ambulatory Visit: Payer: BC Managed Care – PPO | Admitting: Neurology

## 2016-03-26 ENCOUNTER — Encounter: Payer: Self-pay | Admitting: Rehabilitative and Restorative Service Providers"

## 2016-03-26 ENCOUNTER — Ambulatory Visit: Payer: BC Managed Care – PPO | Admitting: Rehabilitative and Restorative Service Providers"

## 2016-03-26 DIAGNOSIS — M5441 Lumbago with sciatica, right side: Secondary | ICD-10-CM

## 2016-03-26 DIAGNOSIS — R262 Difficulty in walking, not elsewhere classified: Secondary | ICD-10-CM

## 2016-03-26 DIAGNOSIS — M62830 Muscle spasm of back: Secondary | ICD-10-CM

## 2016-03-26 NOTE — Patient Instructions (Addendum)
Abdominal Bracing With Pelvic Floor (Hook-Lying)    With neutral spine, tighten pelvic floor and abdominals sucking belly button to back bone, tighten muscles in back at waist. Hold 10 sed  Repeat _10__ times. Do _several __ times a day. Progress to do this in sitting; standing; walking and with functional activities    HIP: Hamstrings - Supine   Place strap around foot. Raise leg up, keeping knee straight.  Bend opposite knee to protect back if indicated. Hold 30 seconds. 3 reps per set, 2-3 sets per day     Outer Hip Stretch: Reclined IT Band Stretch (Strap)   Strap around one foot, pull leg across body until you feel a pull or stretch, with shoulders on mat. Hold for 30 seconds. Repeat 3 times each leg. 2-3 times/day.  Piriformis Stretch   Lying on back, pull right knee toward opposite shoulder.  Start with foot resting on floor or bed across the Left leg, then progress to knee toward opposite shoulder in varying angles.  Hold 30 seconds. Repeat 3 times. Do 2-3 sessions per day.   Quads / HF, Supine   Lie near edge of bed, pull both knees up toward chest. Hold one knee as you drop the other leg off the edge of the bed.  Relax hanging knee/can bend knee back if indicated. Hold 30 seconds. Repeat 3 times per session. Do 2-3 sessions per day.    On back hips and knees bent feet apart - drop one knee in and then the other 10-15 reps as needed between exercises - for relaxation

## 2016-03-26 NOTE — Therapy (Signed)
[facility] [address]  [address] [place], [place], [address] Phone: [phone]   Fax:  [phone]  Physical Therapy Treatment  Patient Details  Name: Candace Stephens MRN: [id number] Date of Birth: 01-Jun-1986 Referring Provider: Glendale Chard, DO  Encounter Date: 03/26/2016      PT End of Session - 03/26/16 1537    Visit Number 8   Number of Visits 12   Date for PT Re-Evaluation 04/09/16   PT Start Time 1532   PT Stop Time 1625   PT Time Calculation (min) 53 min   Activity Tolerance Patient tolerated treatment well      Past Medical History:  Diagnosis Date  . Asthma     Past Surgical History:  Procedure Laterality Date  . APPENDECTOMY    . CESAREAN SECTION    . TONSILLECTOMY      There were no vitals filed for this visit.      Subjective Assessment - 03/26/16 1538    Subjective Very very sore following TDN - but by Saturday and Sunday she was pain free. She is now having pain in the the Rt hip and lower back which is worse with sitting in the car. She is sitting in the chairs at work  and can sit 10-15 min  in floor with legs crossed. She has not been able to do that in a while. 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    Currently in Pain? Yes   Pain Score 2    Pain Location Back   Pain Orientation Right   Pain Descriptors / Indicators Aching   Pain Type Acute pain   Pain Radiating Towards lateral hip    Pain Onset More than a month ago   Pain Frequency Intermittent                         [facility] Adult PT Treatment/Exercise - 03/26/16 0001      Lumbar Exercises: Stretches   Passive Hamstring Stretch 3 reps;30 seconds   Hip Flexor Stretch 3 reps;30 seconds   Hip Flexor Stretch Limitations extremely tight - assisted with strap to avoid "throbbing" and get hip flexor stretch   ITB Stretch 3 reps;30 seconds   Piriformis Stretch 3 reps;30  seconds  travell stretch for piriformis    Piriformis Stretch Limitations varying angles opposite knee to chest 30 sec x 4 positions      Lumbar Exercises: Supine   Other Supine Lumbar Exercises alt knee drop for relaxation - pt hooklying - 10-15 between stretching exercises as needed   Other Supine Lumbar Exercises 3 part core 10 sec x 10      Moist Heat Therapy   Number Minutes Moist Heat 15 Minutes   Moist Heat Location Lumbar Spine;Hip  Rt hip      Manual Therapy   Manual therapy comments pt prone    Soft tissue mobilization bilat lumbar paraspinals; Rt piriformis/glut max/glut med    Myofascial Release lumbar to Rt hip    Kinesiotex --  SI tape 3 strips at 50% in some lumbar flexion - star                 PT Education - 03/26/16 1614    Education provided Yes   Education Details HEP    Person(s) Educated Patient   Methods Explanation;Tactile cues;Demonstration;Verbal cues;Handout  Comprehension Verbalized understanding;Returned demonstration;Verbal cues required;Tactile cues required             PT Long Term Goals - 03/26/16 1551      PT LONG TERM GOAL #1   Title Indpendent with HEP by 04/09/16   Status On-going     PT LONG TERM GOAL #2   Title Lumbar flexibility WNL w/o LBP or R LE radiculopathy by 04/09/16   Status On-going     PT LONG TERM GOAL #3   Title B hip strength >/= 4+/5 w/o pain by 04/09/16   Status On-going     PT LONG TERM GOAL #4   Title Pt will be able to perform normal household chores and job tasks w/o limitation due to R low back/hip or R LE radicular pain by 04/09/16   Status On-going               Plan - 03/26/16 1548    Clinical Impression Statement Very sore with TDN but excellent response - with several pain free days and good gains in moblity and function through Rt hip. Improved muscular tightness noted with palpation and maunal work. No radicular Rt LE pain since last week. Progressing well toward stated goals of  therapy.    Rehab Potential Good   PT Frequency 2x / week   PT Duration 6 weeks   PT Treatment/Interventions Patient/family education;Electrical Stimulation;Moist Heat;Ultrasound;Cryotherapy;Traction;Iontophoresis 4mg /ml Dexamethasone;Manual techniques;Dry needling;Taping;Therapeutic exercise;Neuromuscular re-education;ADLs/Self Care Home Management   PT Next Visit Plan Continue TDN as indicated; Re-assess SIJ as indicated; Manual therapy & modalities to reduce pain; Lumbar/proximal LE flexibility stretches as tolerated; Core/LE strengthening as tolerated. continue kinesotaping to decrease pain and muscular tightness   Consulted and Agree with Plan of Care Patient      Patient will benefit from skilled therapeutic intervention in order to improve the following deficits and impairments:  Pain, Increased muscle spasms, Impaired flexibility, Decreased strength, Decreased activity tolerance, Difficulty walking  Visit Diagnosis: Right-sided low back pain with right-sided sciatica  Muscle spasm of back  Difficulty in walking, not elsewhere classified     Problem List Patient Active Problem List   Diagnosis Date Noted  . 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

## 2016-03-30 ENCOUNTER — Ambulatory Visit: Payer: BC Managed Care – PPO | Admitting: Physical Therapy

## 2016-04-02 ENCOUNTER — Ambulatory Visit: Payer: BC Managed Care – PPO | Admitting: Rehabilitative and Restorative Service Providers"

## 2016-04-02 ENCOUNTER — Encounter: Payer: Self-pay | Admitting: Rehabilitative and Restorative Service Providers"

## 2016-04-02 DIAGNOSIS — R262 Difficulty in walking, not elsewhere classified: Secondary | ICD-10-CM

## 2016-04-02 DIAGNOSIS — M5441 Lumbago with sciatica, right side: Secondary | ICD-10-CM

## 2016-04-02 DIAGNOSIS — M62830 Muscle spasm of back: Secondary | ICD-10-CM

## 2016-04-02 NOTE — Patient Instructions (Addendum)
Deep breathing - allowing chest to expand Hold several seconds  Purse lips and blow air out Repeat 5-10 times as tolerated   Extremity Flexion (Hook-Lying)    Tighten stomach and slowly lower right arm over head until back begins to arch. Keep trunk rigid. Repeat __10__ times per set. Do 1-2____ sets per session. Do _1-2___ sessions per day.

## 2016-04-02 NOTE — Therapy (Signed)
[facility] [address]  [address] [place], [place], [address] Phone: [phone]   Fax:  [phone]  Physical Therapy Treatment  Patient Details  Name: Candace Stephens MRN: [id number] Date of Birth: 01/16/1986 Referring Provider: Glendale Chard, DO  Encounter Date: 04/02/2016      PT End of Session - 04/02/16 1612    Visit Number 9   Number of Visits 12   Date for PT Re-Evaluation 04/09/16   PT Start Time 1530   PT Stop Time 1625   PT Time Calculation (min) 55 min   Activity Tolerance Patient tolerated treatment well      Past Medical History:  Diagnosis Date  . Asthma     Past Surgical History:  Procedure Laterality Date  . APPENDECTOMY    . CESAREAN SECTION    . TONSILLECTOMY      There were no vitals filed for this visit.      Subjective Assessment - 04/02/16 1536    Subjective Good weekend and Monday. 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 Then Wednesday lifted a ~30 pound child to move her from the table to the floor to nap. She has pain in the low back and into the Lt upper back into the chest. Feels like her chest "needs to pop". Just took it easy last night and used the TENS unit -some better. Still has pain in the back, Lt shoulder and chest.    Currently in Pain? Yes   Pain Score 5    Pain Location Back   Pain Orientation Mid;Lower   Pain Descriptors / Indicators Aching   Pain Type Acute pain   Pain Onset More than a month ago   Pain Frequency Constant   Aggravating Factors  moving   Pain Relieving Factors rest and meds; TENS             [facility] PT Assessment - 04/02/16 0001      Assessment   Medical Diagnosis R LE radiculopathy   Referring Provider Glendale Chard, DO   Onset Date/Surgical Date 01/27/16   Next MD Visit 06/08/16   Prior Therapy none     Posture/Postural Control   Posture Comments moves stiffly; does not reverse lumbar lodosis; difficulty bending  forward or lat flexioin/rotation of lumbar spine      AROM   Overall AROM Comments limited lumbar flexion with patient unable to move more than 10% flexion; movement guarded in forward flexion or any lateral movement or rotation      Flexibility   Hamstrings Rt 45 deg; Lt 50 deg   Quadriceps mod tightness Rt & mod/severe hip flexor tightness B   ITB mild tightness bilat    Piriformis tight bilat    Quadratus Lumborum mild/mod tight on R     Palpation   Palpation comment tight bilat lumar paraspinals; Rt QL/lats; Rt hip abductors/piriformis                      [facility] Adult PT Treatment/Exercise - 04/02/16 0001      Lumbar Exercises: Stretches   Passive Hamstring Stretch 1 rep;30 seconds  pain in the LB to Lt posterior shd girdle to ant chest w/ Rt   Single Knee to Chest Stretch --  10 reps x 10 sec      Lumbar Exercises: Supine   Other Supine Lumbar Exercises deep breathing to expand chest wall 5-8 sec  hold x 5; alt shd flexion to comfort level x 10 each    Other Supine Lumbar Exercises 3 part core 10 sec x 10      Moist Heat Therapy   Number Minutes Moist Heat 15 Minutes   Moist Heat Location Lumbar Spine;Hip  Rt hip      Cryotherapy   Number Minutes Cryotherapy 15 Minutes   Cryotherapy Location --  sternum   Type of Cryotherapy Ice pack     Manual Therapy   Manual therapy comments pt prone    Soft tissue mobilization bilat lumbar paraspinals; Rt piriformis/glut max/glut med    Myofascial Release bilat lumbar/hip to thoracic scapular area - more Lt than Rt    Kinesiotex --  SI tape 3 strips at 50% in some lumbar flexion - star                 PT Education - 04/02/16 1611    Education provided Yes   Education Details HEP   Person(s) Educated Patient   Methods Explanation;Demonstration;Tactile cues;Verbal cues;Handout   Comprehension Verbalized understanding;Returned demonstration;Verbal cues required;Tactile cues required              PT Long Term Goals - 03/26/16 1551      PT LONG TERM GOAL #1   Title Indpendent with HEP by 04/09/16   Status On-going     PT LONG TERM GOAL #2   Title Lumbar flexibility WNL w/o LBP or R LE radiculopathy by 04/09/16   Status On-going     PT LONG TERM GOAL #3   Title B hip strength >/= 4+/5 w/o pain by 04/09/16   Status On-going     PT LONG TERM GOAL #4   Title Pt will be able to perform normal household chores and job tasks w/o limitation due to R low back/hip or R LE radicular pain by 04/09/16   Status On-going               Plan - 04/02/16 1711    Clinical Impression Statement Patient presents with significant flare up of LBP and onset of Lt upper back to scapular pain radiating into the anterior chest to sternum folling lifting a child at work and attempting to lower child to the floor for her nap. She has significant tightness through the lumbar and thoracic spine; tenderness to palpation Lt sternum and costocondral junctions through upper half to 2/3's of the sternum; guarded movement through upper body and trunk; shallow breath pattern; increased pain. Patient reported some improvement in symtpoms with treatment with focus on gentle movement; deep breathing; UE movement; manual work through the thoracolumbar spine and into the Lt scapular area. Continues to have good response to the taping.     Rehab Potential Good   PT Frequency 2x / week   PT Duration 6 weeks   PT Treatment/Interventions Patient/family education;Electrical Stimulation;Moist Heat;Ultrasound;Cryotherapy;Traction;Iontophoresis 4mg /ml Dexamethasone;Manual techniques;Dry needling;Taping;Therapeutic exercise;Neuromuscular re-education;ADLs/Self Care Home Management   PT Next Visit Plan Continue TDN as indicated; Re-assess SIJ as indicated; Manual therapy & modalities to reduce pain; Lumbar/proximal LE flexibility stretches as tolerated; Core/LE strengthening as tolerated. continue kinesotaping to decrease pain and  muscular tightness.   Consulted and Agree with Plan of Care Patient      Patient will benefit from skilled therapeutic intervention in order to improve the following deficits and impairments:  Pain, Increased muscle spasms, Impaired flexibility, Decreased strength, Decreased activity tolerance, Difficulty walking  Visit Diagnosis: Right-sided low back pain with right-sided sciatica  Muscle spasm of back  Difficulty in walking, not elsewhere classified     Problem List Patient Active Problem List   Diagnosis Date Noted  . Radicular syndrome of right leg 02/25/2016    Vanisha Whiten Rober Minion PT, MPH  04/02/2016, 5:19 PM  [facility] [address]  [address] [place], [place], [address] Phone: [phone]   Fax:  [phone]  Name: Candace Stephens MRN: [id number] Date of Birth: 02-27-1986

## 2016-04-06 ENCOUNTER — Other Ambulatory Visit: Payer: Self-pay | Admitting: *Deleted

## 2016-04-06 ENCOUNTER — Encounter: Payer: Self-pay | Admitting: Neurology

## 2016-04-06 DIAGNOSIS — M5410 Radiculopathy, site unspecified: Secondary | ICD-10-CM

## 2016-04-09 ENCOUNTER — Ambulatory Visit: Payer: BC Managed Care – PPO | Admitting: Physical Therapy

## 2016-04-09 DIAGNOSIS — M5441 Lumbago with sciatica, right side: Secondary | ICD-10-CM | POA: Diagnosis not present

## 2016-04-09 DIAGNOSIS — M62830 Muscle spasm of back: Secondary | ICD-10-CM

## 2016-04-09 DIAGNOSIS — R262 Difficulty in walking, not elsewhere classified: Secondary | ICD-10-CM

## 2016-04-09 NOTE — Therapy (Addendum)
[facility] [place] [address]  [place], [place], [address] Phone: [phone]   Fax:  [phone]  Physical Therapy Treatment  Patient Details  Name: CLAUDEEN LEASON MRN: [id number] Date of Birth: 24-Jan-1986 Referring Provider: Alda Berthold, DO  Encounter Date: 04/09/2016      PT End of Session - 04/09/16 1615    Visit Number 10   Number of Visits 12   Date for PT Re-Evaluation 04/09/16   PT Start Time 1448   PT Stop Time 1656   PT Time Calculation (min) 41 min   Activity Tolerance Patient limited by pain   Behavior During Therapy [facility] for tasks assessed/performed      Past Medical History:  Diagnosis Date  . Asthma     Past Surgical History:  Procedure Laterality Date  . APPENDECTOMY    . CESAREAN SECTION    . TONSILLECTOMY      There were no vitals filed for this visit.      Subjective Assessment - 04/09/16 1617    Subjective Pt reports pain has remained sever since the flare-up last week. Unable to get any significant lasting relief from hot/cold modalities, TENS or meds. Has been scheduled for a MRI next Wednesday.   Patient Stated Goals "To be able to be back to normal - tolerate standing and walking for longer period of time"   Currently in Pain? Yes   Pain Score --  5-6/10   Pain Location Back   Pain Orientation Mid;Lower;Right   Pain Descriptors / Indicators Throbbing   Pain Type Acute pain   Pain Onset More than a month ago   Pain Frequency Constant            [facility] PT Assessment - 04/09/16 1615      Observation/Other Assessments   Focus on Therapeutic Outcomes (FOTO)  Lumbar Spine - 32% (68% limited)          Today's Treatment  Manual STM/MFR to B lumbar paraspinals   SIJ Assessment - reveals apparent R posterior innominate rotation of pelvis on sacrum Performed MET to correct rotation followed by pelvic shotgun - No cavitation heard/felt, but alignment appeared  neutralized after 1st rep of MET  TherEx Cat/camel 5x5"  (limited tolerance) Quadruped Child's pose 2x30' Seated 3 way Prayer stretch with green (65 cm) Pball x30" DKTC with heels on peanut ball 5x10"  Kinesiotape  B thoracolumbar paraspinals - 2 parallel "I" strips - 30% along muscles from mid thoracic spine to distal to SIJ B SIJ - 2 additonal "I" strips in 50% star crossing over vertical paraspinal strips          PT Long Term Goals - 03/26/16 1551      PT LONG TERM GOAL #1   Title Indpendent with HEP by 04/09/16   Status On-going     PT LONG TERM GOAL #2   Title Lumbar flexibility WNL w/o LBP or R LE radiculopathy by 04/09/16   Status On-going     PT LONG TERM GOAL #3   Title B hip strength >/= 4+/5 w/o pain by 04/09/16   Status On-going     PT LONG TERM GOAL #4   Title Pt will be able to perform normal household chores and job tasks w/o limitation due to R low back/hip or R LE radicular pain by 04/09/16   Status On-going               Plan -  04/09/16 1645    Clinical Impression Statement Pt reports pain has remained significantly elevated since flare-up last week with pain now spanning thoracic and lumbar spine. Reassessment of SIJ indicated apparent return of R posterior innominate rotation of the pelvis on sacrum which was corrected with MET. Pt with limited tolerance for gentle movement or manual work today although made good effort during PT session. Pt continues to note benefit from kinesiotaping therefore taping applied to thoracolumbar paraspinals and B SIJ. Pt scheduled to have MRI next Wed 04/15/16 to further evaluate potential cause(s) of current pain.   Rehab Potential --   PT Frequency --   PT Duration --   PT Treatment/Interventions Patient/family education;Electrical Stimulation;Moist Heat;Ultrasound;Cryotherapy;Traction;Iontophoresis 4m/ml Dexamethasone;Manual techniques;Dry needling;Taping;Therapeutic exercise;Neuromuscular re-education;ADLs/Self  Care Home Management   PT Next Visit Plan Continue TDN as indicated; Re-assess SIJ as indicated; Manual therapy & modalities to reduce pain; Lumbar/proximal LE flexibility stretches as tolerated; Core/LE strengthening as tolerated. continue kinesotaping to decrease pain and muscular tightness.   Consulted and Agree with Plan of Care Patient      Patient will benefit from skilled therapeutic intervention in order to improve the following deficits and impairments:  Pain, Increased muscle spasms, Impaired flexibility, Decreased strength, Decreased activity tolerance, Difficulty walking  Visit Diagnosis: Right-sided low back pain with right-sided sciatica  Muscle spasm of back  Difficulty in walking, not elsewhere classified     Problem List Patient Active Problem List   Diagnosis Date Noted  . Radicular syndrome of right leg 02/25/2016    JPercival Spanish PT, MPT 04/09/2016, 7:22 PM  [facility] [place] [address]Phone: [phone]  Fax:  [phone] Name: TJAHAYRA MAZOMRN: [id number] of Birth: 102/18/1987

## 2016-04-13 ENCOUNTER — Ambulatory Visit: Payer: No Typology Code available for payment source | Attending: Neurology | Admitting: Physical Therapy

## 2016-04-13 DIAGNOSIS — M62830 Muscle spasm of back: Secondary | ICD-10-CM | POA: Insufficient documentation

## 2016-04-13 DIAGNOSIS — M546 Pain in thoracic spine: Secondary | ICD-10-CM | POA: Insufficient documentation

## 2016-04-13 DIAGNOSIS — M5441 Lumbago with sciatica, right side: Secondary | ICD-10-CM | POA: Diagnosis not present

## 2016-04-13 DIAGNOSIS — R262 Difficulty in walking, not elsewhere classified: Secondary | ICD-10-CM | POA: Diagnosis present

## 2016-04-13 NOTE — Patient Instructions (Signed)

## 2016-04-13 NOTE — Therapy (Signed)
[facility] [address]  [place], [place], [address] Phone: [phone]   Fax:  [phone]  Physical Therapy Treatment  Patient Details  Name: Candace Stephens MRN: [id number] Date of Birth: 17-Nov-1985 Referring Provider: Alda Berthold, DO  Encounter Date: 04/13/2016      PT End of Session - 04/13/16 1530    Visit Number 11   Number of Visits 24   Date for PT Re-Evaluation 05/29/16   PT Start Time 5809   PT Stop Time 1619   PT Time Calculation (min) 49 min   Activity Tolerance Patient limited by pain   Behavior During Therapy [facility] for tasks assessed/performed      Past Medical History:  Diagnosis Date  . Asthma     Past Surgical History:  Procedure Laterality Date  . APPENDECTOMY    . CESAREAN SECTION    . TONSILLECTOMY      There were no vitals filed for this visit.      Subjective Assessment - 04/13/16 1530    Subjective Pt reports pain is better today and feels that taping from last visit helped.   Patient Stated Goals "To be able to be back to normal - tolerate standing and walking for longer period of time"   Currently in Pain? Yes   Pain Score --  2-3/10   Pain Location Back  + upper thoracic + R hip   Pain Orientation Lower;Upper;Right   Pain Descriptors / Indicators Aching   Pain Type Acute pain   Pain Radiating Towards R hip   Pain Onset More than a month ago   Pain Frequency Constant   Aggravating Factors  moving   Pain Relieving Factors rest, TENS and heat   Effect of Pain on Daily Activities Pain limits all daily tasks and job reponsibilities            [facility] PT Assessment - 04/13/16 1530      Assessment   Medical Diagnosis R LE radiculopathy   Referring Provider Alda Berthold, DO   Onset Date/Surgical Date 01/27/16   Next MD Visit 06/08/16     Prior Function   Level of Independence Independent   Vocation Full time employment  Out of work until 03/13/16 per MD    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, walking, stooping, sitting in small chairs   Leisure Relax - Walk on TM 2-3/month     Observation/Other Assessments   Focus on Therapeutic Outcomes (FOTO)  Lumbar Spine - 32% (68% limited)  as of 04/09/16     AROM   AROM Assessment Site Lumbar   Lumbar Flexion hands to mid shins   Lumbar Extension 25% + increased pain   Lumbar - Right Side Bend hand to just above knee + iincreased pain   Lumbar - Left Side Bend hand to lateral knee - no change in pain   Lumbar - Right Rotation  WFL - no increased pain   Lumbar - Left Rotation  WFL - no increased pain     Strength   Right Hip Flexion 4-/5   Right Hip Extension 4-/5   Right Hip ABduction 4-/5   Right Hip ADduction 4-/5   Left Hip Flexion 4/5   Left Hip Extension 4/5   Left Hip ABduction 4/5   Left Hip ADduction 4/5     Flexibility   Hamstrings Rt 60 deg; Lt 75 deg   Quadriceps mod tightness Rt &  mod/severe hip flexor tightness B   ITB mild tightness bilat    Piriformis moderately tight B   Quadratus Lumborum mild/mod tight on R          Today's Treatment  TherEx Rec bike - level 1 x 5'   Manual SIJ Assessment - reveals apparent normal alignment today Manual B HS, ITB, KTOS & figure 4 piriformis stretches x30" each  TherEx LTR 5x10" TrA + Bridge + B Isometric Hip ABD with belt attempted but poor hip clearance  Self Care/Home Mgt Posture and body mechanics education during typical household chores         PT Education - 04/13/16 1615    Education provided Yes   Education Details Posture & body mechanics education during typical household chores   Person(s) Educated Patient   Methods Explanation;Demonstration;Handout   Comprehension Verbalized understanding;Need further instruction             PT Long Term Goals - 04/13/16 1555      PT LONG TERM GOAL #1   Title Indpendent with HEP by 05/29/16   Status On-going     PT LONG TERM GOAL #2    Title Lumbar flexibility WNL w/o LBP or R LE radiculopathy by 05/29/16   Status On-going  Pt remains very guarded in all motions due to LBP, but radicular pain extending no further than R hip at present.     PT LONG TERM GOAL #3   Title B hip strength >/= 4+/5 w/o pain by 05/29/16   Status On-going  R hip grossly 4-/5, L hip grossly 4/5     PT LONG TERM GOAL #4   Title Pt will be able to perform normal household chores and job tasks w/o limitation due to R low back/hip or R LE radicular pain by 05/29/16   Status On-going  Pt reporting majority of daily activities still limited by lower cervical, upper thoracic, low back and R hip pain               Plan - 04/13/16 1619    Clinical Impression Statement Pt reporting pain is somewhat improved from recent flare-up but states that she has been avoiding as much of her normal activities as possible. Lumbar ROM returning toward full ROM but pt still significantly limited in extension and noting increased pain with flexion, extension and R sidebending. LE strength improving but R hip remains weaker than L by 1/2 grade and increased pain reported with MMT on R. No goals met at this time due to recent flare-up, therefore will recert for additonal 2x/wk x 6 weeks unless MRI on Wed 04/15/16 indicates a reason not to continue with PT. In order to encourage pt to ease back into normal daily tasks, majority of today's visit beyond the recert focused on training in proper posture and body mechanics during typical houeshold chores.   Rehab Potential Good   PT Frequency 2x / week   PT Duration 6 weeks   PT Treatment/Interventions Patient/family education;Electrical Stimulation;Moist Heat;Ultrasound;Cryotherapy;Traction;Iontophoresis 69m/ml Dexamethasone;Manual techniques;Dry needling;Taping;Therapeutic exercise;Neuromuscular re-education;Therapeutic activities;ADLs/Self Care Home Management   PT Next Visit Plan Continue TDN as indicated; Re-assess SIJ as  indicated; Manual therapy & modalities to reduce pain; Lumbar/proximal LE flexibility stretches as tolerated; Core/LE strengthening as tolerated. continue kinesotaping to decrease pain and muscular tightness.   Consulted and Agree with Plan of Care Patient      Patient will benefit from skilled therapeutic intervention in order to improve the following deficits and impairments:  Pain, Increased muscle spasms, Impaired flexibility, Decreased strength, Decreased activity tolerance, Difficulty walking  Visit Diagnosis: Acute right-sided low back pain with right-sided sciatica  Muscle spasm of back  Pain in thoracic spine  Difficulty in walking, not elsewhere classified     Problem List Patient Active Problem List   Diagnosis Date Noted  . Radicular syndrome of right leg 02/25/2016    Percival Spanish, PT, MPT 04/13/2016, 6:53 PM  [facility] [address]  [place], [place], [address] Phone: [phone]   Fax:  [phone]  Name: Candace Stephens MRN: [id number] Date of Birth: 1986-01-03

## 2016-04-14 ENCOUNTER — Telehealth: Payer: Self-pay | Admitting: Neurology

## 2016-04-14 NOTE — Telephone Encounter (Signed)
Patient states that she was to have a MRI tomorrow and [facility] it she would like to talk to someone please call [phone]

## 2016-04-14 NOTE — Telephone Encounter (Signed)
Spoke with patient. She wanted to know if even though her insurance hasn't approved scanned if they could bill her and she could pay for them later. I instructed her to contact [facility] to discuss billing policies with them directly.

## 2016-04-15 ENCOUNTER — Other Ambulatory Visit: Payer: BC Managed Care – PPO

## 2016-04-15 ENCOUNTER — Ambulatory Visit
Admission: RE | Admit: 2016-04-15 | Discharge: 2016-04-15 | Disposition: A | Discharge status: Home / Self Care | Payer: Self-pay | Source: Ambulatory Visit | Attending: Neurology | Admitting: Neurology

## 2016-04-15 ENCOUNTER — Telehealth: Payer: Self-pay

## 2016-04-15 ENCOUNTER — Inpatient Hospital Stay: Admission: RE | Admit: 2016-04-15 | Payer: BC Managed Care – PPO | Source: Ambulatory Visit

## 2016-04-15 DIAGNOSIS — M5410 Radiculopathy, site unspecified: Secondary | ICD-10-CM

## 2016-04-15 NOTE — Telephone Encounter (Signed)
-----   Message from Glendale Chardonika K Patel, DO sent at 04/15/2016  1:47 PM EDT ----- Please inform patient that her MRI of her cervical and lumbar spine is entirely normal and does not show any nerve injury.  Incidentally, the MRI did show a cyst over her right ovary - she should follow-up with her PCP or OBGYN to have pelvic ultrasound to better evaluate this.  Thanks.

## 2016-04-15 NOTE — Telephone Encounter (Signed)
Message relayed to patient. Verbalized understanding and denied questions.   

## 2016-04-15 NOTE — Telephone Encounter (Signed)
Pt called back to ask if she needed to still pursue PT. Advised I'd only call back if provider did not want her to go to physical therapy.

## 2016-04-16 ENCOUNTER — Encounter: Payer: Self-pay | Admitting: Rehabilitative and Restorative Service Providers"

## 2016-04-16 ENCOUNTER — Ambulatory Visit: Payer: No Typology Code available for payment source | Admitting: Rehabilitative and Restorative Service Providers"

## 2016-04-16 DIAGNOSIS — M5441 Lumbago with sciatica, right side: Secondary | ICD-10-CM

## 2016-04-16 DIAGNOSIS — M62830 Muscle spasm of back: Secondary | ICD-10-CM

## 2016-04-16 DIAGNOSIS — M546 Pain in thoracic spine: Secondary | ICD-10-CM

## 2016-04-16 DIAGNOSIS — R262 Difficulty in walking, not elsewhere classified: Secondary | ICD-10-CM

## 2016-04-16 NOTE — Therapy (Signed)
[facility] [address]  [address] [place], [place], [address] Phone: [phone]   Fax:  [phone]  Physical Therapy Treatment  Patient Details  Name: Candace Stephens MRN: [id number] Date of Birth: 10-01-1985 Referring Provider: Glendale Chard, DO  Encounter Date: 04/16/2016      PT End of Session - 04/16/16 1540    Visit Number 12   Number of Visits 24   Date for PT Re-Evaluation 05/29/16   PT Start Time 1533   PT Stop Time 1618   PT Time Calculation (min) 45 min      Past Medical History:  Diagnosis Date  . Asthma     Past Surgical History:  Procedure Laterality Date  . APPENDECTOMY    . CESAREAN SECTION    . TONSILLECTOMY      There were no vitals filed for this visit.      Subjective Assessment - 04/16/16 1537    Subjective Heard from MRI and she has a cyst on the rt ovary. She has been to MDs about how to manage problem. They feel the LBP is related at least in part to the ovarian cyst. She is awaiting a call from the gyn.    Currently in Pain? Yes   Pain Score 3    Pain Location Back   Pain Orientation Right;Lower;Upper   Pain Type Acute pain   Pain Onset More than a month ago   Pain Frequency Constant                         [facility] Adult PT Treatment/Exercise - 04/16/16 0001      Lumbar Exercises: Stretches   Passive Hamstring Stretch 1 rep;30 seconds  pain in the LB to Lt posterior shd girdle to ant chest w/ Rt   Single Knee to Chest Stretch --  10 reps x 10 sec    ITB Stretch 3 reps;30 seconds     Lumbar Exercises: Aerobic   Stationary Bike Nu step L5 x 6 min      Lumbar Exercises: Supine   Clam 20 reps   Bent Knee Raise 20 reps  each leg   Bridge 5 reps  discomfort - stopped   Other Supine Lumbar Exercises 3 part core 10 sec x 10      Moist Heat Therapy   Number Minutes Moist Heat 20 Minutes   Moist Heat Location Lumbar Spine;Hip  Rt hip      Electrical  Stimulation   Electrical Stimulation Location Lt Lumbar    Electrical Stimulation Action IFC   Electrical Stimulation Parameters to tolerance   Electrical Stimulation Goals Pain;Tone     Manual Therapy   Manual therapy comments pt prone    Soft tissue mobilization bilat lumbar paraspinals; Rt piriformis/glut max/glut med    Myofascial Release bilat lumbar/hip to thoracic scapular area - more Lt than Rt                      PT Long Term Goals - 04/13/16 1555      PT LONG TERM GOAL #1   Title Indpendent with HEP by 05/29/16   Status On-going     PT LONG TERM GOAL #2   Title Lumbar flexibility WNL w/o LBP or R LE radiculopathy by 05/29/16   Status On-going  Pt remains very guarded in all motions due to LBP, but radicular pain extending no further  than R hip at present.     PT LONG TERM GOAL #3   Title B hip strength >/= 4+/5 w/o pain by 05/29/16   Status On-going  R hip grossly 4-/5, L hip grossly 4/5     PT LONG TERM GOAL #4   Title Pt will be able to perform normal household chores and job tasks w/o limitation due to R low back/hip or R LE radicular pain by 05/29/16   Status On-going  Pt reporting majority of daily activities still limited by lower cervical, upper thoracic, low back and R hip pain               Plan - 04/16/16 1541    Clinical Impression Statement Pt reports that at least part of her pain is related to an ovarian cyct that was seen on MRI. She is awaiting call from MD to determine course of treatment. She is cleared by MD ro continue PT at this time. Treatment today focused on core stabilization with patient having difficulty tolerating exercise and unable to progress with stabilization. Discussion with pt and we will consider holding PT until course of treatment is determined for ovarian cyst. Patient will call when she hears from the GYN.    Rehab Potential Good   PT Frequency 2x / week   PT Duration 6 weeks   PT Treatment/Interventions  Patient/family education;Electrical Stimulation;Moist Heat;Ultrasound;Cryotherapy;Traction;Iontophoresis 4mg /ml Dexamethasone;Manual techniques;Dry needling;Taping;Therapeutic exercise;Neuromuscular re-education;Therapeutic activities;ADLs/Self Care Home Management   PT Next Visit Plan Awaiting for MD direction re management of Rt ovarian cyst.Continue TDN as indicated; Re-assess SIJ as indicated; Manual therapy & modalities to reduce pain; Lumbar/proximal LE flexibility stretches as tolerated; Core/LE strengthening as tolerated. continue kinesotaping to decrease pain and muscular tightness as indicated      Patient will benefit from skilled therapeutic intervention in order to improve the following deficits and impairments:  Pain, Increased muscle spasms, Impaired flexibility, Decreased strength, Decreased activity tolerance, Difficulty walking  Visit Diagnosis: Acute right-sided low back pain with right-sided sciatica  Muscle spasm of back  Pain in thoracic spine  Difficulty in walking, not elsewhere classified     Problem List Patient Active Problem List   Diagnosis Date Noted  . 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

## 2016-04-16 NOTE — Telephone Encounter (Signed)
Agree, continue PT.

## 2016-04-20 ENCOUNTER — Ambulatory Visit: Payer: No Typology Code available for payment source | Admitting: Physical Therapy

## 2016-04-20 DIAGNOSIS — R262 Difficulty in walking, not elsewhere classified: Secondary | ICD-10-CM

## 2016-04-20 DIAGNOSIS — M5441 Lumbago with sciatica, right side: Secondary | ICD-10-CM | POA: Diagnosis not present

## 2016-04-20 DIAGNOSIS — M546 Pain in thoracic spine: Secondary | ICD-10-CM

## 2016-04-20 DIAGNOSIS — M62830 Muscle spasm of back: Secondary | ICD-10-CM

## 2016-04-20 NOTE — Therapy (Signed)
[facility] [address]  [address] [place], [place], [address] Phone: [phone]   Fax:  [phone]  Physical Therapy Treatment  Patient Details  Name: Candace Stephens MRN: [id number] Date of Birth: 07-Apr-1986 Referring Provider: Glendale Chardonika K Patel, DO  Encounter Date: 04/20/2016      PT End of Session - 04/20/16 1530    Visit Number 13   Number of Visits 24   Date for PT Re-Evaluation 05/29/16   PT Start Time 1530   PT Stop Time 1615   PT Time Calculation (min) 45 min   Activity Tolerance Patient tolerated treatment well   Behavior During Therapy [facility] for tasks assessed/performed      Past Medical History:  Diagnosis Date  . Asthma     Past Surgical History:  Procedure Laterality Date  . APPENDECTOMY    . CESAREAN SECTION    . TONSILLECTOMY      There were no vitals filed for this visit.      Subjective Assessment - 04/20/16 1532    Subjective Pt reports her MD has opted to defer surgery for ovarian cyst at this time. Is on pain meds that seem to be helping, but notes return of pain if misses a dose. Able to perform basic household chores utilizing body mechanics training provided previoulsy. Feels pain today is the best that it has been recently.   Patient Stated Goals "To be able to be back to normal - tolerate standing and walking for longer period of time"   Currently in Pain? Yes   Pain Score 3    Pain Location Back  & buttock   Pain Orientation Lower;Right   Pain Onset More than a month ago           Today's Treatment  TherEx Rec bike - level 1 x 6'   Manual SIJ Assessment - reveals apparent normal alignment today Manual B HS, ITB, KTOS & figure 4 piriformis stretches x30" each  TherEx LTR 5x10" Hooklying Clam with green TB 10x3' Hooklying Alt Hip ABD/ER with green TB 10x3" DKTC with heels on orange (55 cm) Pball 10x3" Alt Quad set/Hip extension into orange (55 cm) Pball x5 Straight  leg bridge with heels on orange (55 cm) Pball 10x3" LTR with lower legs on orange (55 cm ) Pball 10x3" Cat/camel 10x5" Quadruped 3 way Child's pose/Prayer stretch x30" Quadruped Alt Hip extension 10x3"          PT Long Term Goals - 04/13/16 1555      PT LONG TERM GOAL #1   Title Indpendent with HEP by 05/29/16   Status On-going     PT LONG TERM GOAL #2   Title Lumbar flexibility WNL w/o LBP or R LE radiculopathy by 05/29/16   Status On-going  Pt remains very guarded in all motions due to LBP, but radicular pain extending no further than R hip at present.     PT LONG TERM GOAL #3   Title B hip strength >/= 4+/5 w/o pain by 05/29/16   Status On-going  R hip grossly 4-/5, L hip grossly 4/5     PT LONG TERM GOAL #4   Title Pt will be able to perform normal household chores and job tasks w/o limitation due to R low back/hip or R LE radicular pain by 05/29/16   Status On-going  Pt reporting majority of daily activities still limited by lower cervical, upper thoracic, low back and R hip  pain               Plan - 04/20/16 1537    Clinical Impression Statement Pt reporting pain is the least that it's been recently and demonstrating improving tolerance for exercises with no increased pain reported today. GYN wanting to "wait & see" regarding ovarian cyst and pt to f/u in 6-8 weeks, therefore will continue with PT POC per pt tolerance.   Rehab Potential Good   PT Frequency 2x / week   PT Duration 6 weeks   PT Treatment/Interventions Patient/family education;Electrical Stimulation;Moist Heat;Ultrasound;Cryotherapy;Traction;Iontophoresis 4mg /ml Dexamethasone;Manual techniques;Dry needling;Taping;Therapeutic exercise;Neuromuscular re-education;Therapeutic activities;ADLs/Self Care Home Management   PT Next Visit Plan Continue TDN as indicated; Re-assess SIJ as indicated; Manual therapy & modalities to reduce pain; Lumbar/proximal LE flexibility stretches as tolerated; Core/LE  strengthening as tolerated. continue kinesotaping to decrease pain and muscular tightness as indicated   Consulted and Agree with Plan of Care Patient      Patient will benefit from skilled therapeutic intervention in order to improve the following deficits and impairments:  Pain, Increased muscle spasms, Impaired flexibility, Decreased strength, Decreased activity tolerance, Difficulty walking  Visit Diagnosis: Acute right-sided low back pain with right-sided sciatica  Muscle spasm of back  Pain in thoracic spine  Difficulty in walking, not elsewhere classified     Problem List Patient Active Problem List   Diagnosis Date Noted  . 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  [address] [place], [place], [address] Phone: [phone]   Fax:  [phone]  Name: SHENIQUE CHILDERS MRN: [id number] Date of Birth: 04-May-1986

## 2016-04-23 ENCOUNTER — Encounter: Payer: BC Managed Care – PPO | Admitting: Physical Therapy

## 2016-04-27 ENCOUNTER — Ambulatory Visit: Payer: No Typology Code available for payment source | Admitting: Physical Therapy

## 2016-04-30 ENCOUNTER — Ambulatory Visit: Payer: No Typology Code available for payment source | Admitting: Physical Therapy

## 2016-04-30 DIAGNOSIS — M546 Pain in thoracic spine: Secondary | ICD-10-CM

## 2016-04-30 DIAGNOSIS — M5441 Lumbago with sciatica, right side: Secondary | ICD-10-CM

## 2016-04-30 DIAGNOSIS — R262 Difficulty in walking, not elsewhere classified: Secondary | ICD-10-CM

## 2016-04-30 DIAGNOSIS — M62830 Muscle spasm of back: Secondary | ICD-10-CM
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
## 2016-05-04 ENCOUNTER — Ambulatory Visit: Payer: No Typology Code available for payment source | Admitting: Physical Therapy

## 2016-05-04 DIAGNOSIS — M5441 Lumbago with sciatica, right side: Secondary | ICD-10-CM | POA: Diagnosis not present

## 2016-05-04 DIAGNOSIS — M62830 Muscle spasm of back: Secondary | ICD-10-CM

## 2016-05-04 NOTE — Therapy (Signed)
[facility] [place] [address]  [address] [place], [place], [address] Phone: [phone]   Fax:  [phone]  Physical Therapy Treatment  Patient Details  Name: Candace Stephens MRN: [id number] Date of Birth: [birth year] Referring Provider: [place] Chardonika K Patel, DO  Encounter Date: 05/04/2016      PT End of Session - 05/04/16 1618    Visit Number 15   Number of Visits 24   Date for PT Re-Evaluation 05/29/16   PT Start Time 1618   PT Stop Time 1658   PT Time Calculation (min) 40 min   Activity Tolerance Patient tolerated treatment well   Behavior During Therapy [facility] for tasks assessed/performed      Past Medical History:  Diagnosis Date  . Asthma     Past Surgical History:  Procedure Laterality Date  . APPENDECTOMY    . CESAREAN SECTION    . TONSILLECTOMY      There were no vitals filed for this visit.      Subjective Assessment - 05/04/16 1624    Subjective Pt reports pain is better after taping but could continue to feel the muscle twitching while tape was in place.   Patient Stated Goals "To be able to be back to normal - tolerate standing and walking for longer period of time"   Currently in Pain? Yes   Pain Score 2    Pain Location Buttocks   Pain Orientation Right   Pain Descriptors / Indicators Aching   Pain Frequency Intermittent          Today's Treatment  TherEx Rec bike - level 3 x 6'  DKTC with heels on orange (55 cm) Pball 15x3" Straight leg bridge with heels on orange (55 cm) Pball 15x5" LTR with lower legs on orange (55 cm ) Pball 15x5" Bridge 5x5" Bridge + Alt Bent knee fall-out x5 Bridge + Alt Hip ABD/ER with green TB x10 B Sidelying Clam with green TB 10x3'  Kinesiotape  R piriformis - 2 "I" strips - 30% along muscle with 50% perpendicular at area of greatest tenderness          PT Long Term Goals - 04/13/16 1555      PT LONG TERM GOAL #1   Title Indpendent with HEP by 05/29/16    Status On-going     PT LONG TERM GOAL #2   Title Lumbar flexibility WNL w/o LBP or R LE radiculopathy by 05/29/16   Status On-going  Pt remains very guarded in all motions due to LBP, but radicular pain extending no further than R hip at present.     PT LONG TERM GOAL #3   Title B hip strength >/= 4+/5 w/o pain by 05/29/16   Status On-going  R hip grossly 4-/5, L hip grossly 4/5     PT LONG TERM GOAL #4   Title Pt will be able to perform normal household chores and job tasks w/o limitation due to R low back/hip or R LE radicular pain by 05/29/16   Status On-going  Pt reporting majority of daily activities still limited by lower cervical, upper thoracic, low back and R hip pain               Plan - 05/04/16 1626    Clinical Impression Statement Pt reporting pain continues to improve, down to 2/10 today and more intermittent. Able to resume more exercises including bridges w/o pain but did note some muscle strain/fatigue. Tape reapplied  as pt continuing to note benefit.   Rehab Potential Good   PT Frequency 2x / week   PT Duration 6 weeks   PT Treatment/Interventions Patient/family education;Electrical Stimulation;Moist Heat;Ultrasound;Cryotherapy;Traction;Iontophoresis 4mg /ml Dexamethasone;Manual techniques;Dry needling;Taping;Therapeutic exercise;Neuromuscular re-education;Therapeutic activities;ADLs/Self Care Home Management   PT Next Visit Plan Continue TDN as indicated; Re-assess SIJ as indicated; Manual therapy & modalities to reduce pain; Lumbar/proximal LE flexibility stretches as tolerated; Core/LE strengthening as tolerated. continue kinesotaping to decrease pain and muscular tightness as indicated   Consulted and Agree with Plan of Care Patient      Patient will benefit from skilled therapeutic intervention in order to improve the following deficits and impairments:  Pain, Increased muscle spasms, Impaired flexibility, Decreased strength, Decreased activity tolerance,  Difficulty walking  Visit Diagnosis: Acute right-sided low back pain with right-sided sciatica  Muscle spasm of back     Problem List Patient Active Problem List   Diagnosis Date Noted  . 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  [address] [place], [place], [address] Phone: [phone]   Fax:  [phone]  Name: Candace Stephens MRN: [id number] Date of Birth: 06-Jun-1986

## 2016-05-07 ENCOUNTER — Ambulatory Visit: Payer: No Typology Code available for payment source | Admitting: Rehabilitative and Restorative Service Providers"

## 2016-05-11 ENCOUNTER — Ambulatory Visit: Payer: No Typology Code available for payment source | Admitting: Physical Therapy

## 2016-05-11 DIAGNOSIS — R262 Difficulty in walking, not elsewhere classified: Secondary | ICD-10-CM

## 2016-05-11 DIAGNOSIS — M62830 Muscle spasm of back: Secondary | ICD-10-CM

## 2016-05-11 DIAGNOSIS — M5441 Lumbago with sciatica, right side: Secondary | ICD-10-CM | POA: Diagnosis not present

## 2016-05-11 DIAGNOSIS — M546 Pain in thoracic spine: Secondary | ICD-10-CM

## 2016-05-11 NOTE — Therapy (Signed)
[facility] [place] [address]  [address] [place], [place], [address] Phone: [phone]   Fax:  [phone]  Physical Therapy Treatment  Patient Details  Name: Candace Stephens MRN: [id number] Date of Birth: 1985/12/21 Referring Provider: [place] Chardonika K Patel, DO  Encounter Date: 05/11/2016      PT End of Session - 05/11/16 1535    Visit Number 16   Number of Visits 24   Date for PT Re-Evaluation 05/29/16   PT Start Time 1535   PT Stop Time 1618   PT Time Calculation (min) 43 min   Activity Tolerance Patient tolerated treatment well   Behavior During Therapy [facility] for tasks assessed/performed      Past Medical History:  Diagnosis Date  . Asthma     Past Surgical History:  Procedure Laterality Date  . APPENDECTOMY    . CESAREAN SECTION    . TONSILLECTOMY      There were no vitals filed for this visit.      Subjective Assessment - 05/11/16 1535    Subjective Pt reporting continued mild pain today but thinks today's pain is at least in part due to her ovarian cysts (new cyst identified). Reports she has been able to do household chores using posture and body mechanics handout guidelines.   Patient Stated Goals "To be able to be back to normal - tolerate standing and walking for longer period of time"   Currently in Pain? Yes   Pain Score 3    Pain Location Hip   Pain Orientation Right   Pain Descriptors / Indicators Aching          Today's Treatment  TherEx Rec bike - level 4x 6'   Manual Manual B HS, ITB, KTOS & figure 4 piriformis stretches x30" each STM & stretching to L iliopsoas in mod thomas position  TherEx Seated on orange (55 cm) Pball:    Low Row with blue TB x10    Scapular retraction + Shoulder extension with blue TB x10    Isometric trunk rotation chest press with blue TB x10    Trunk rotation with blue TB x10 TRX Squat x10 Wall sits 10x3"          PT Education - 05/11/16 1615    Education provided Yes   Education Details Physioball exercises   Person(s) Educated Patient   Methods Explanation;Demonstration;Handout   Comprehension Verbalized understanding;Returned demonstration             PT Long Term Goals - 04/13/16 1555      PT LONG TERM GOAL #1   Title Indpendent with HEP by 05/29/16   Status On-going     PT LONG TERM GOAL #2   Title Lumbar flexibility WNL w/o LBP or R LE radiculopathy by 05/29/16   Status On-going  Pt remains very guarded in all motions due to LBP, but radicular pain extending no further than R hip at present.     PT LONG TERM GOAL #3   Title B hip strength >/= 4+/5 w/o pain by 05/29/16   Status On-going  R hip grossly 4-/5, L hip grossly 4/5     PT LONG TERM GOAL #4   Title Pt will be able to perform normal household chores and job tasks w/o limitation due to R low back/hip or R LE radicular pain by 05/29/16   Status On-going  Pt reporting majority of daily activities still limited by lower cervical, upper thoracic, low  back and R hip pain               Plan - 05/11/16 1615    Clinical Impression Statement Pt feels that taping has helped and has continued to perform piriformis self-release with ball when muscle starts to "twitch". Progressed core stabilization activities to seated on Pball with good tolerance, therefore added these exercises to HEP.   Rehab Potential Good   PT Frequency 2x / week   PT Duration 6 weeks   PT Treatment/Interventions Patient/family education;Electrical Stimulation;Moist Heat;Ultrasound;Cryotherapy;Traction;Iontophoresis 4mg /ml Dexamethasone;Manual techniques;Dry needling;Taping;Therapeutic exercise;Neuromuscular re-education;Therapeutic activities;ADLs/Self Care Home Management   PT Next Visit Plan Assess reponse to update HEP; Assess goal status; Continue TDN as indicated; Re-assess SIJ as indicated; Manual therapy & modalities to reduce pain; Lumbar/proximal LE flexibility stretches as  tolerated; Core/LE strengthening as tolerated. continue kinesotaping to decrease pain and muscular tightness as indicated   Consulted and Agree with Plan of Care Patient      Patient will benefit from skilled therapeutic intervention in order to improve the following deficits and impairments:  Pain, Increased muscle spasms, Impaired flexibility, Decreased strength, Decreased activity tolerance, Difficulty walking  Visit Diagnosis: Acute right-sided low back pain with right-sided sciatica  Muscle spasm of back  Pain in thoracic spine  Difficulty in walking, not elsewhere classified     Problem List Patient Active Problem List   Diagnosis Date Noted  . 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

## 2016-05-14 ENCOUNTER — Ambulatory Visit: Payer: No Typology Code available for payment source | Attending: Neurology | Admitting: Physical Therapy

## 2016-05-14 DIAGNOSIS — M5441 Lumbago with sciatica, right side: Secondary | ICD-10-CM

## 2016-05-14 DIAGNOSIS — M62830 Muscle spasm of back: Secondary | ICD-10-CM

## 2016-05-14 DIAGNOSIS — R262 Difficulty in walking, not elsewhere classified: Secondary | ICD-10-CM

## 2016-05-14 DIAGNOSIS — M546 Pain in thoracic spine: Secondary | ICD-10-CM | POA: Diagnosis present

## 2016-05-14 NOTE — Therapy (Signed)
[facility] [place] [address] [place], [place], [address] Phone: [phone]   Fax:  [phone]  Physical Therapy Treatment  Patient Details  Name: Candace Stephens MRN: [id number] Date of Birth: 1985/10/06 Referring Provider: Alda Berthold, DO  Encounter Date: 05/14/2016      PT End of Session - 05/14/16 1530    Visit Number 16   Number of Visits 24   Date for PT Re-Evaluation 05/29/16   PT Start Time 6962   PT Stop Time 1611   PT Time Calculation (min) 41 min   Activity Tolerance Patient tolerated treatment well   Behavior During Therapy [facility] for tasks assessed/performed      Past Medical History:  Diagnosis Date  . Asthma     Past Surgical History:  Procedure Laterality Date  . APPENDECTOMY    . CESAREAN SECTION    . TONSILLECTOMY      There were no vitals filed for this visit.      Subjective Assessment - 05/14/16 1533    Subjective Pt reports she has "been feeling pretty great", with no pain.   Patient Stated Goals "To be able to be back to normal - tolerate standing and walking for longer period of time"   Currently in Pain? No/denies            [facility] PT Assessment - 05/14/16 1530      Assessment   Medical Diagnosis R LE radiculopathy   Referring Provider Alda Berthold, DO   Onset Date/Surgical Date 01/27/16   Next MD Visit 06/08/16     AROM   Overall AROM Comments pain free   AROM Assessment Site Lumbar   Lumbar Flexion WNL   Lumbar Extension WNL   Lumbar - Right Side Bend WNL   Lumbar - Left Side Bend WNL   Lumbar - Right Rotation WNL   Lumbar - Left Rotation WNL     Strength   Strength Assessment Site Hip   Right Hip Flexion 4+/5   Right Hip Extension 4/5   Right Hip ABduction 4+/5   Right Hip ADduction 4/5   Left Hip Flexion 4+/5   Left Hip Extension 4+/5   Left Hip ABduction 5/5   Left Hip ADduction 4/5     Flexibility   Hamstrings Rt 80 deg; Lt 85 deg   Quadriceps  mild tightness B including hip flexors mildly tight B   ITB WFL   Piriformis mild tightness B   Quadratus Lumborum mild tightness B      Today's Treatment  TherEx Rec bike - level 4x 6'  Seated on orange (55 cm) Pball:    Roll out into bridge position 5x5"    Bridge position with upper back on ball - Alt Knee extension 2x5 TRX Low Row x10 TRX Lat Pullover x10 TRX Reverse Lunge x10 Wall sits on orange (55 cm) Pball + Hip ADD ball squeeze 5x10" B Side plank (elbow to ankle) 5x10"           PT Long Term Goals - 05/14/16 1535      PT LONG TERM GOAL #1   Title Indpendent with HEP by 05/29/16   Status Partially Met  Met for current HEP     PT LONG TERM GOAL #2   Title Lumbar flexibility WNL w/o LBP or R LE radiculopathy by 05/29/16   Status Achieved     PT LONG TERM GOAL #3   Title  B hip strength >/= 4+/5 w/o pain by 05/29/16   Status Partially Met  Met except B hip ADD & R hip extension 4/5     PT LONG TERM GOAL #4   Title Pt will be able to perform normal household chores and job tasks w/o limitation due to R low back/hip or R LE radicular pain by 05/29/16   Status Achieved               Plan - 05/14/16 1534    Clinical Impression Statement Pt pain free and reporting no issues or concerns with latest HEP progression. Goal assessment reveals excellent progress with PT, with full lumbar ROM restored w/o pain, improving LE flexility, and increasing proximal LE stretch. All goals at least partially met with LTG's #2 & 4 now met. Continued progression of core stabilization activities with some fatigue noted but no increased pain.   Rehab Potential Good   PT Frequency 2x / week   PT Duration 6 weeks   PT Treatment/Interventions Patient/family education;Electrical Stimulation;Moist Heat;Ultrasound;Cryotherapy;Traction;Iontophoresis 39m/ml Dexamethasone;Manual techniques;Dry needling;Taping;Therapeutic exercise;Neuromuscular re-education;Therapeutic  activities;ADLs/Self Care Home Management   PT Next Visit Plan Continue TDN as indicated; Re-assess SIJ as indicated; Manual therapy & modalities to reduce pain; Lumbar/proximal LE flexibility stretches as tolerated; Core/LE strengthening as tolerated. continue kinesotaping to decrease pain and muscular tightness as indicated   Consulted and Agree with Plan of Care Patient      Patient will benefit from skilled therapeutic intervention in order to improve the following deficits and impairments:  Pain, Increased muscle spasms, Impaired flexibility, Decreased strength, Decreased activity tolerance, Difficulty walking  Visit Diagnosis: Acute right-sided low back pain with right-sided sciatica  Muscle spasm of back  Pain in thoracic spine  Difficulty in walking, not elsewhere classified     Problem List Patient Active Problem List   Diagnosis Date Noted  . 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

## 2016-05-18 ENCOUNTER — Ambulatory Visit: Payer: No Typology Code available for payment source | Admitting: Physical Therapy

## 2016-05-18 DIAGNOSIS — M5441 Lumbago with sciatica, right side: Secondary | ICD-10-CM

## 2016-05-18 DIAGNOSIS — M62830 Muscle spasm of back: Secondary | ICD-10-CM

## 2016-05-18 DIAGNOSIS — M546 Pain in thoracic spine: Secondary | ICD-10-CM

## 2016-05-18 DIAGNOSIS — R262 Difficulty in walking, not elsewhere classified: Secondary | ICD-10-CM

## 2016-05-18 NOTE — Therapy (Signed)
[facility] [place] [address]  [place], [place], [address] Phone: [phone]   Fax:  [phone]  Physical Therapy Treatment  Patient Details  Name: Candace Stephens MRN: [id number] Date of Birth: 06/02/1986 Referring Provider: Alda Berthold, DO  Encounter Date: 05/18/2016      PT End of Session - 05/18/16 1538    Visit Number 17   Number of Visits 24   Date for PT Re-Evaluation 05/29/16   PT Start Time 3810   PT Stop Time 1620   PT Time Calculation (min) 42 min   Activity Tolerance Patient tolerated treatment well   Behavior During Therapy [facility] for tasks assessed/performed      Past Medical History:  Diagnosis Date  . Asthma     Past Surgical History:  Procedure Laterality Date  . APPENDECTOMY    . CESAREAN SECTION    . TONSILLECTOMY      There were no vitals filed for this visit.      Subjective Assessment - 05/18/16 1542    Subjective Pt reporting some R LQ pain today, but thinks this is related to her ovarian cyst rather than the musculoskeletal pain that she normally experiences.   Patient Stated Goals "To be able to be back to normal - tolerate standing and walking for longer period of time"   Currently in Pain? Yes   Pain Score 4    Pain Location Abdomen   Pain Orientation Right;Lower   Pain Descriptors / Indicators Aching            Today's Treatment  TherEx Rec bike - level 4x 6'  Seated on orange (55 cm) Pball: Roll out into bridge position 10x5"    Bridge position with upper back on ball - Alt Knee extension 10x5"    Scapular retraction + B Shoulder flexion with blue TB 10x3"    Scapular retraction + B Shoulder extension with blue TB 10x3"    Scapular retraction + B Shoulder horiz ABD with blue TB 10x3"    B Trunk rotation with blue TB x10 Bridge + HS curl with heels on orange (55 cm) Pball 10x5" TRX Low Row x10 TRX Mid Row x10 TRX Rear Deltoid Fly x10 TRX B Torso  Rotation x10 TRX Lat Pullover x10 B Side plank (elbow to ankle) 5x15" B Side plank + Hip ABD x5 Prone Plank (on hands) + Alt Hip Extension x10           PT Long Term Goals - 05/14/16 1535      PT LONG TERM GOAL #1   Title Indpendent with HEP by 05/29/16   Status Partially Met  Met for current HEP     PT LONG TERM GOAL #2   Title Lumbar flexibility WNL w/o LBP or R LE radiculopathy by 05/29/16   Status Achieved     PT LONG TERM GOAL #3   Title B hip strength >/= 4+/5 w/o pain by 05/29/16   Status Partially Met  Met except B hip ADD & R hip extension 4/5     PT LONG TERM GOAL #4   Title Pt will be able to perform normal household chores and job tasks w/o limitation due to R low back/hip or R LE radicular pain by 05/29/16   Status Achieved               Plan - 05/18/16 1545    Clinical Impression Statement Pt noting some R  LQ pain today, but attributes this to her ovarian cyst(s). Able to tolerate continued progression of core strengthening with no pain reported. Pt reporting good carryover of therapy exercises into HEP with good compliance with HEP. Given recent lack of apparent musculoskeletal pain as well as good complaince with HEP, anticipate pt should be able to transition to HEP w/in next 1-2 visits and proceed with D/C from PT.   Rehab Potential Good   PT Frequency 2x / week   PT Duration 6 weeks   PT Treatment/Interventions Patient/family education;Electrical Stimulation;Moist Heat;Ultrasound;Cryotherapy;Traction;Iontophoresis 11m/ml Dexamethasone;Manual techniques;Dry needling;Taping;Therapeutic exercise;Neuromuscular re-education;Therapeutic activities;ADLs/Self Care Home Management   PT Next Visit Plan Probable D/C w/in next 1-2 visits; Continue TDN as indicated; Re-assess SIJ as indicated; Manual therapy & modalities to reduce pain; Lumbar/proximal LE flexibility stretches as tolerated; Core/LE strengthening as tolerated. continue kinesotaping to decrease  pain and muscular tightness as indicated   Consulted and Agree with Plan of Care Patient      Patient will benefit from skilled therapeutic intervention in order to improve the following deficits and impairments:  Pain, Increased muscle spasms, Impaired flexibility, Decreased strength, Decreased activity tolerance, Difficulty walking  Visit Diagnosis: Acute right-sided low back pain with right-sided sciatica  Muscle spasm of back  Pain in thoracic spine  Difficulty in walking, not elsewhere classified     Problem List Patient Active Problem List   Diagnosis Date Noted  . 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

## 2016-05-19 ENCOUNTER — Encounter: Payer: Self-pay | Admitting: Gynecology

## 2016-05-19 ENCOUNTER — Ambulatory Visit (INDEPENDENT_AMBULATORY_CARE_PROVIDER_SITE_OTHER): Payer: BC Managed Care – PPO | Admitting: Gynecology

## 2016-05-19 VITALS — BP 118/76 | Ht 64.0 in | Wt 137.0 lb

## 2016-05-19 DIAGNOSIS — R110 Nausea: Secondary | ICD-10-CM | POA: Diagnosis not present

## 2016-05-19 DIAGNOSIS — N83201 Unspecified ovarian cyst, right side: Secondary | ICD-10-CM | POA: Diagnosis not present

## 2016-05-19 DIAGNOSIS — N938 Other specified abnormal uterine and vaginal bleeding: Secondary | ICD-10-CM | POA: Diagnosis not present

## 2016-05-19 DIAGNOSIS — N83202 Unspecified ovarian cyst, left side: Secondary | ICD-10-CM | POA: Diagnosis not present

## 2016-05-19 DIAGNOSIS — Z23 Encounter for immunization: Secondary | ICD-10-CM | POA: Diagnosis not present

## 2016-05-19 LAB — CBC WITH DIFFERENTIAL/PLATELET
BASOS ABS: 0 {cells}/uL (ref 0–200)
Basophils Relative: 0 %
EOS PCT: 1 %
Eosinophils Absolute: 50 cells/uL (ref 15–500)
HEMATOCRIT: 36.7 % (ref 35.0–45.0)
Hemoglobin: 11.8 g/dL (ref 11.7–15.5)
Lymphocytes Relative: 39 %
Lymphs Abs: 1950 cells/uL (ref 850–3900)
MCH: 27.1 pg (ref 27.0–33.0)
MCHC: 32.2 g/dL (ref 32.0–36.0)
MCV: 84.2 fL (ref 80.0–100.0)
MONO ABS: 400 {cells}/uL (ref 200–950)
MPV: 9.6 fL (ref 7.5–12.5)
Monocytes Relative: 8 %
NEUTROS PCT: 52 %
Neutro Abs: 2600 cells/uL (ref 1500–7800)
Platelets: 194 10*3/uL (ref 140–400)
RBC: 4.36 MIL/uL (ref 3.80–5.10)
RDW: 14.1 % (ref 11.0–15.0)
WBC: 5 10*3/uL (ref 3.8–10.8)

## 2016-05-19 LAB — PREGNANCY, URINE: PREG TEST UR: NEGATIVE

## 2016-05-19 MED ORDER — MEGESTROL ACETATE 40 MG PO TABS: 40.0000 mg | ORAL_TABLET | Freq: Two times a day (BID) | ORAL | 1 refills | Status: DC

## 2016-05-19 MED ORDER — METOCLOPRAMIDE HCL 10 MG PO TABS: 10.0000 mg | ORAL_TABLET | Freq: Three times a day (TID) | ORAL | 1 refills | Status: DC

## 2016-05-19 NOTE — Addendum Note (Signed)
Addended by: Berna SpareASTILLO, Ellyse Rotolo A on: 05/19/2016 03:58 PM   Modules accepted: Orders
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
## 2016-05-19 NOTE — Patient Instructions (Addendum)
Influenza Virus Vaccine (Flucelvax) What is this medicine? INFLUENZA VIRUS VACCINE (in floo EN zuh VAHY ruhs vak SEEN) helps to reduce the risk of getting influenza also known as the flu. The vaccine only helps protect you against some strains of the flu. This medicine may be used for other purposes; ask your health care provider or pharmacist if you have questions. What should I tell my health care provider before I take this medicine? They need to know if you have any of these conditions: -bleeding disorder like hemophilia -fever or infection -Guillain-Barre syndrome or other neurological problems -immune system problems -infection with the human immunodeficiency virus (HIV) or AIDS -low blood platelet counts -multiple sclerosis -an unusual or allergic reaction to influenza virus vaccine, other medicines, foods, dyes or preservatives -pregnant or trying to get pregnant -breast-feeding How should I use this medicine? This vaccine is for injection into a muscle. It is given by a health care professional. A copy of Vaccine Information Statements will be given before each vaccination. Read this sheet carefully each time. The sheet may change frequently. Talk to your pediatrician regarding the use of this medicine in children. Special care may be needed. Overdosage: If you think you've taken too much of this medicine contact a poison control center or emergency room at once. Overdosage: If you think you have taken too much of this medicine contact a poison control center or emergency room at once. NOTE: This medicine is only for you. Do not share this medicine with others. What if I miss a dose? This does not apply. What may interact with this medicine? -chemotherapy or radiation therapy -medicines that lower your immune system like etanercept, anakinra, infliximab, and adalimumab -medicines that treat or prevent blood clots like warfarin -phenytoin -steroid medicines like prednisone or  cortisone -theophylline -vaccines This list may not describe all possible interactions. Give your health care provider a list of all the medicines, herbs, non-prescription drugs, or dietary supplements you use. Also tell them if you smoke, drink alcohol, or use illegal drugs. Some items may interact with your medicine. What should I watch for while using this medicine? Report any side effects that do not go away within 3 days to your doctor or health care professional. Call your health care provider if any unusual symptoms occur within 6 weeks of receiving this vaccine. You may still catch the flu, but the illness is not usually as bad. You cannot get the flu from the vaccine. The vaccine will not protect against colds or other illnesses that may cause fever. The vaccine is needed every year. What side effects may I notice from receiving this medicine? Side effects that you should report to your doctor or health care professional as soon as possible: -allergic reactions like skin rash, itching or hives, swelling of the face, lips, or tongue Side effects that usually do not require medical attention (Report these to your doctor or health care professional if they continue or are bothersome.): -fever -headache -muscle aches and pains -pain, tenderness, redness, or swelling at the injection site -tiredness This list may not describe all possible side effects. Call your doctor for medical advice about side effects. You may report side effects to FDA at 1-800-FDA-1088. Where should I keep my medicine? The vaccine will be given by a health care professional in a clinic, pharmacy, doctor's office, or other health care setting. You will not be given vaccine doses to store at home. NOTE: This sheet is a summary. It may not cover  all possible information. If you have questions about this medicine, talk to your doctor, pharmacist, or health care provider.    2016, Elsevier/Gold Standard. (2011-06-10  14:06:47) Ovarian Cyst An ovarian cyst is a fluid-filled sac that forms on an ovary. The ovaries are small organs that produce eggs in women. Various types of cysts can form on the ovaries. Most are not cancerous. Many do not cause problems, and they often go away on their own. Some may cause symptoms and require treatment. Common types of ovarian cysts include:  Functional cysts--These cysts may occur every month during the menstrual cycle. This is normal. The cysts usually go away with the next menstrual cycle if the woman does not get pregnant. Usually, there are no symptoms with a functional cyst.  Endometrioma cysts--These cysts form from the tissue that lines the uterus. They are also called "chocolate cysts" because they become filled with blood that turns brown. This type of cyst can cause pain in the lower abdomen during intercourse and with your menstrual period.  Cystadenoma cysts--This type develops from the cells on the outside of the ovary. These cysts can get very big and cause lower abdomen pain and pain with intercourse. This type of cyst can twist on itself, cut off its blood supply, and cause severe pain. It can also easily rupture and cause a lot of pain.  Dermoid cysts--This type of cyst is sometimes found in both ovaries. These cysts may contain different kinds of body tissue, such as skin, teeth, hair, or cartilage. They usually do not cause symptoms unless they get very big.  Theca lutein cysts--These cysts occur when too much of a certain hormone (human chorionic gonadotropin) is produced and overstimulates the ovaries to produce an egg. This is most common after procedures used to assist with the conception of a baby (in vitro fertilization). CAUSES   Fertility drugs can cause a condition in which multiple large cysts are formed on the ovaries. This is called ovarian hyperstimulation syndrome.  A condition called polycystic ovary syndrome can cause hormonal imbalances that  can lead to nonfunctional ovarian cysts. SIGNS AND SYMPTOMS  Many ovarian cysts do not cause symptoms. If symptoms are present, they may include:  Pelvic pain or pressure.  Pain in the lower abdomen.  Pain during sexual intercourse.  Increasing girth (swelling) of the abdomen.  Abnormal menstrual periods.  Increasing pain with menstrual periods.  Stopping having menstrual periods without being pregnant. DIAGNOSIS  These cysts are commonly found during a routine or annual pelvic exam. Tests may be ordered to find out more about the cyst. These tests may include:  Ultrasound.  X-ray of the pelvis.  CT scan.  MRI.  Blood tests. TREATMENT  Many ovarian cysts go away on their own without treatment. Your health care provider may want to check your cyst regularly for 2-3 months to see if it changes. For women in menopause, it is particularly important to monitor a cyst closely because of the higher rate of ovarian cancer in menopausal women. When treatment is needed, it may include any of the following:  A procedure to drain the cyst (aspiration). This may be done using a long needle and ultrasound. It can also be done through a laparoscopic procedure. This involves using a thin, lighted tube with a tiny camera on the end (laparoscope) inserted through a small incision.  Surgery to remove the whole cyst. This may be done using laparoscopic surgery or an open surgery involving a larger incision in  the lower abdomen.  Hormone treatment or birth control pills. These methods are sometimes used to help dissolve a cyst. HOME CARE INSTRUCTIONS   Only take over-the-counter or prescription medicines as directed by your health care provider.  Follow up with your health care provider as directed.  Get regular pelvic exams and Pap tests. SEEK MEDICAL CARE IF:   Your periods are late, irregular, or painful, or they stop.  Your pelvic pain or abdominal pain does not go away.  Your  abdomen becomes larger or swollen.  You have pressure on your bladder or trouble emptying your bladder completely.  You have pain during sexual intercourse.  You have feelings of fullness, pressure, or discomfort in your stomach.  You lose weight for no apparent reason.  You feel generally ill.  You become constipated.  You lose your appetite.  You develop acne.  You have an increase in body and facial hair.  You are gaining weight, without changing your exercise and eating habits.  You think you are pregnant. SEEK IMMEDIATE MEDICAL CARE IF:   You have increasing abdominal pain.  You feel sick to your stomach (nauseous), and you throw up (vomit).  You develop a fever that comes on suddenly.  You have abdominal pain during a bowel movement.  Your menstrual periods become heavier than usual. MAKE SURE YOU:  Understand these instructions.  Will watch your condition.  Will get help right away if you are not doing well or get worse.   This information is not intended to replace advice given to you by your health care provider. Make sure you discuss any questions you have with your health care provider.   Document Released: 06/29/2005 Document Revised: 07/04/2013 Document Reviewed: 03/06/2013 Elsevier Interactive Patient Education 2016 Elsevier Inc. CA-125 Tumor Marker Test WHY AM I HAVING THIS TEST? This test is used to check the level of cancer antigen 125 (CA-125) in your blood. The CA-125 tumor marker test can be helpful in detecting ovarian cancer. The test is only performed if you are considered at high risk for ovarian cancer. Your health care provider may recommend this test if:  You have a strong family history of ovarian cancer.  You have a breast cancer antigen (BRCA) genetic defect. If you have already been diagnosed with ovarian cancer, your health care provider may use this test to help identify the extent of the disease and to monitor your response to  treatment. WHAT KIND OF SAMPLE IS TAKEN? A blood sample is required for this test. It is usually collected by inserting a needle into a vein. HOW DO I PREPARE FOR THE TEST? There is no preparation required for this test. WHAT ARE THE REFERENCE RANGES? Reference ranges are considered healthy ranges established after testing a large group of healthy people. Reference ranges may vary among different people, labs, and hospitals. It is your responsibility to obtain your test results. Ask the lab or department performing the test when and how you will get your results. The reference range for this test is 0-35 units/mL or less than 35 kunits/L (SI units). WHAT DO THE RESULTS MEAN? Increased levels of CA-125 may indicate:  Certain types of cancer, including:  Ovarian cancer.  Pancreatic cancer.  Colon cancer.  Lung cancer.  Breast cancer.  Lymphoma.  Noncancerous (benign) disorders, including:  Cirrhosis.  Pregnancy.  Endometriosis.  Pancreatitis.  Pelvic inflammatory disease (PID). Talk with your health care provider to discuss your results, treatment options, and if necessary, the need for  more tests. Talk with your health care provider if you have any questions about your results.   This information is not intended to replace advice given to you by your health care provider. Make sure you discuss any questions you have with your health care provider.   Document Released: 07/21/2004 Document Revised: 07/20/2014 Document Reviewed: 11/16/2013 Elsevier Interactive Patient Education [employer].

## 2016-05-20 LAB — CA 125: CA 125: 17 U/mL (ref <35)

## 2016-05-21 ENCOUNTER — Telehealth: Payer: Self-pay | Admitting: *Deleted

## 2016-05-21 MED ORDER — NORETHIN ACE-ETH ESTRAD-FE 1-20 MG-MCG(24) PO TABS: ORAL_TABLET | ORAL | 3 refills | Status: DC

## 2016-05-21 NOTE — Telephone Encounter (Signed)
Pt called was seen on OV 05/19/16 states a birth control pills was to be sent to pharmacy and this was not done. Per note" [place] and had prescribed her Blisovi 24 Fe oral contraceptive pill which she has not started"  Is this the Rx you would like patient to have? With directions "I've asked her to skip the last 4 days for the first 2 packs and then take all 28 tablets on the third pack "  Please advise

## 2016-05-21 NOTE — Telephone Encounter (Signed)
Yes please call in the prescription and have her take it as described in the note

## 2016-05-21 NOTE — Telephone Encounter (Signed)
Pt aware, Rx sent. 

## 2016-05-25 ENCOUNTER — Ambulatory Visit: Payer: No Typology Code available for payment source | Admitting: Physical Therapy

## 2016-05-25 DIAGNOSIS — M62830 Muscle spasm of back: Secondary | ICD-10-CM

## 2016-05-25 DIAGNOSIS — M546 Pain in thoracic spine: Secondary | ICD-10-CM

## 2016-05-25 DIAGNOSIS — M5441 Lumbago with sciatica, right side: Secondary | ICD-10-CM

## 2016-05-25 DIAGNOSIS — R262 Difficulty in walking, not elsewhere classified: Secondary | ICD-10-CM

## 2016-05-25 NOTE — Therapy (Signed)
[facility] [address]  [place], [place], [address] Phone: [phone]   Fax:  [phone]  Physical Therapy Treatment  Patient Details  Name: Candace Stephens MRN: [id number] Date of Birth: October 13, 1985 Referring Provider: Alda Berthold, DO  Encounter Date: 05/25/2016      PT End of Session - 05/25/16 1533    Visit Number 18   Number of Visits 24   Date for PT Re-Evaluation 05/29/16   PT Start Time 1093   PT Stop Time 1611   PT Time Calculation (min) 38 min   Activity Tolerance Patient tolerated treatment well   Behavior During Therapy [facility] for tasks assessed/performed      Past Medical History:  Diagnosis Date  . Asthma     Past Surgical History:  Procedure Laterality Date  . APPENDECTOMY    . CESAREAN SECTION    . TONSILLECTOMY      There were no vitals filed for this visit.      Subjective Assessment - 05/25/16 1533    Subjective "Feeling great"   Patient Stated Goals "To be able to be back to normal - tolerate standing and walking for longer period of time"   Currently in Pain? No/denies           Today's Treatment  TherEx Rec bike - level 5x 6'  B Side plank + Hip ABD 5x5" B Side plank (elbow to ankle) 3x30" Prone Plank (on hands) + Alt Hip Extension 10x5" Prone Plank (on hands) + Alt UE/LE lift 5x3" Superman 10x5" TRX Mid Row x10 TRX Rear Deltoid Fly x10 TRX Y Fly x10 TRX Plank 2x15" TRX Prone plank crunch x6+4 TRX Mountain climber 2x10            PT Long Term Goals - 05/14/16 1535      PT LONG TERM GOAL #1   Title Indpendent with HEP by 05/29/16   Status Partially Met  Met for current HEP     PT LONG TERM GOAL #2   Title Lumbar flexibility WNL w/o LBP or R LE radiculopathy by 05/29/16   Status Achieved     PT LONG TERM GOAL #3   Title B hip strength >/= 4+/5 w/o pain by 05/29/16   Status Partially Met  Met except B hip ADD & R hip extension 4/5     PT  LONG TERM GOAL #4   Title Pt will be able to perform normal household chores and job tasks w/o limitation due to R low back/hip or R LE radicular pain by 05/29/16   Status Achieved               Plan - 05/25/16 1539    Clinical Impression Statement Pt reporting good complaince with HEP but states plank exercises are still the most challenging, therefore reviewed these and discussed progression for HEP. Pt also stating she plans on purchasing a home TRX style set-up, therefore reviewed relevant core/back exercises using TRX. Pt remains pain-free and feels ready to transition to HEP after next visit, therefore will complete D/C assessment and provide TRX exercise instructions for home at final visit.   Rehab Potential Good   PT Frequency 2x / week   PT Duration 6 weeks   PT Treatment/Interventions Patient/family education;Electrical Stimulation;Moist Heat;Ultrasound;Cryotherapy;Traction;Iontophoresis 52m/ml Dexamethasone;Manual techniques;Dry needling;Taping;Therapeutic exercise;Neuromuscular re-education;Therapeutic activities;ADLs/Self Care Home Management   PT Next Visit Plan Discharge assessment; provide instructions for home TRX exercises   Consulted and Agree  with Plan of Care Patient      Patient will benefit from skilled therapeutic intervention in order to improve the following deficits and impairments:  Pain, Increased muscle spasms, Impaired flexibility, Decreased strength, Decreased activity tolerance, Difficulty walking  Visit Diagnosis: Acute right-sided low back pain with right-sided sciatica  Muscle spasm of back  Pain in thoracic spine  Difficulty in walking, not elsewhere classified     Problem List Patient Active Problem List   Diagnosis Date Noted  . Ovarian cyst, bilateral 05/19/2016  . Radicular syndrome of right leg 02/25/2016    Percival Spanish, PT, MPT 05/25/2016, 4:25 PM  [facility] [address]  [place], [place], [address] Phone: [phone]   Fax:  [phone]  Name: Candace Stephens MRN: [id number] Date of Birth: Aug 31, 1985

## 2016-05-28 ENCOUNTER — Ambulatory Visit: Payer: No Typology Code available for payment source | Admitting: Physical Therapy

## 2016-05-28 DIAGNOSIS — M5441 Lumbago with sciatica, right side: Secondary | ICD-10-CM

## 2016-05-28 DIAGNOSIS — M62830 Muscle spasm of back: Secondary | ICD-10-CM

## 2016-05-28 DIAGNOSIS — M546 Pain in thoracic spine: Secondary | ICD-10-CM

## 2016-05-28 DIAGNOSIS — R262 Difficulty in walking, not elsewhere classified: Secondary | ICD-10-CM

## 2016-05-28 NOTE — Therapy (Signed)
[facility] [address]  [place], [place], [address] Phone: [phone]   Fax:  [phone]  Physical Therapy Treatment  Patient Details  Name: Candace Stephens MRN: [id number] Date of Birth: [birth year] Referring Provider: Alda Berthold, DO  Encounter Date: 05/28/2016      PT End of Session - 05/28/16 1537    Visit Number 20   Number of Visits 24   Date for PT Re-Evaluation 05/29/16   PT Start Time 5009   PT Stop Time 1607   PT Time Calculation (min) 33 min   Activity Tolerance Patient tolerated treatment well   Behavior During Therapy [facility] for tasks assessed/performed      Past Medical History:  Diagnosis Date  . Asthma     Past Surgical History:  Procedure Laterality Date  . APPENDECTOMY    . CESAREAN SECTION    . TONSILLECTOMY      There were no vitals filed for this visit.      Subjective Assessment - 05/28/16 1536    Subjective "Having a good day today." Pt remains painfree and feels ready to transition to HEP.   Patient Stated Goals "To be able to be back to normal - tolerate standing and walking for longer period of time"   Currently in Pain? No/denies            [facility] PT Assessment - 05/28/16 1534      Assessment   Medical Diagnosis R LE radiculopathy   Referring Provider Alda Berthold, DO   Onset Date/Surgical Date 01/27/16   Next MD Visit 06/08/16   Prior Therapy none     Observation/Other Assessments   Focus on Therapeutic Outcomes (FOTO)  Lumbar Spine - 94% (6% limitation)     AROM   Overall AROM  Within functional limits for tasks performed   Overall AROM Comments pain free   AROM Assessment Site Lumbar     Strength   Strength Assessment Site Hip   Right Hip Flexion 5/5   Right Hip Extension 5/5   Right Hip ABduction 5/5   Right Hip ADduction 5/5   Left Hip Flexion 5/5   Left Hip Extension 5/5   Left Hip ABduction 5/5   Left Hip ADduction 5/5     Flexibility   Soft Tissue Assessment /Muscle Length --  WFL/WNL           Today's Treatment  TherEx Rec bike - level 6x 6'  TRX Plank 2x15" TRX Prone plank to pike x5 TRX Prone plank to crunch x10 TRX Side plank with hip tap x10 TRX Mountain climber 2x10 TRX Mid Row x10 TRX Rear Deltoid Fly x10 TRX Y Fly x10 TRX Lat Pullover x10           PT Education - 05/28/16 1600    Education provided Yes   Education Details TRX for HEP   Person(s) Educated Patient   Methods Explanation;Demonstration;Handout   Comprehension Verbalized understanding;Returned demonstration             PT Long Term Goals - 05/28/16 1542      PT LONG TERM GOAL #1   Title Indpendent with HEP by 05/29/16   Status Achieved     PT LONG TERM GOAL #2   Title Lumbar flexibility WNL w/o LBP or R LE radiculopathy by 05/29/16   Status Achieved     PT LONG TERM GOAL #3   Title B hip strength >/=  4+/5 w/o pain by 05/29/16   Status Achieved  Met except B hip ADD & R hip extension 4/5     PT LONG TERM GOAL #4   Title Pt will be able to perform normal household chores and job tasks w/o limitation due to R low back/hip or R LE radicular pain by 05/29/16   Status Achieved               Plan - 05/28/16 1537    Clinical Impression Statement Pt has demonstrated excellent progress recently, with complete resolution of pain, restoration of full lumbar ROM with normal proximal LE flexibility w/o pain and return of 5/5 hip strength. Pt is independent with HEP and feels confident with transition to HEP. All goals met, therefore will proceed with D/C from PT for this episode.   Rehab Potential Good   PT Frequency 2x / week   PT Duration 6 weeks   PT Treatment/Interventions Patient/family education;Electrical Stimulation;Moist Heat;Ultrasound;Cryotherapy;Traction;Iontophoresis 72m/ml Dexamethasone;Manual techniques;Dry needling;Taping;Therapeutic exercise;Neuromuscular re-education;Therapeutic  activities;ADLs/Self Care Home Management   PT Next Visit Plan Discharge    Consulted and Agree with Plan of Care Patient      Patient will benefit from skilled therapeutic intervention in order to improve the following deficits and impairments:  Pain, Increased muscle spasms, Impaired flexibility, Decreased strength, Decreased activity tolerance, Difficulty walking  Visit Diagnosis: Acute right-sided low back pain with right-sided sciatica  Muscle spasm of back  Pain in thoracic spine  Difficulty in walking, not elsewhere classified     Problem List Patient Active Problem List   Diagnosis Date Noted  . Ovarian cyst, bilateral 05/19/2016  . Radicular syndrome of right leg 02/25/2016    JPercival Spanish PT, MPT 05/28/2016, 4:16 PM  [facility] [place] [address]Phone: [phone]  Fax:  [phone] Name: Candace PICKERTMRN: [id number] of Birth: 1April 18, 1987  PHYSICAL THERAPY DISCHARGE SUMMARY  Visits from Start of Care: 20  Current functional level related to goals / functional outcomes:     Refer to above clinical impression.   Remaining deficits:   None at this time. Pt to continue with HEP.   Education / Equipment:    HEP  Plan: Patient agrees to discharge.  Patient goals were met. Patient is being discharged due to meeting the stated rehab goals.  ?????     JPercival Spanish PT, MPT 05/28/16, 4:16 PM  [facility] [place] [address]Phone: [phone]  Fax:  [phone]

## 2016-06-08 ENCOUNTER — Encounter: Payer: Self-pay | Admitting: Neurology

## 2016-06-08 ENCOUNTER — Ambulatory Visit (INDEPENDENT_AMBULATORY_CARE_PROVIDER_SITE_OTHER): Payer: BC Managed Care – PPO | Admitting: Neurology

## 2016-06-08 VITALS — BP 120/80 | HR 121 | Ht 64.0 in | Wt 136.2 lb

## 2016-06-08 DIAGNOSIS — G5622 Lesion of ulnar nerve, left upper limb: Secondary | ICD-10-CM
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
## 2016-06-08 NOTE — Patient Instructions (Addendum)
1.  NCS/EMG left of arm - ulnar protocol 2.  Discontinue gabapentin  3.  Continue flexeril 5mg  at bedtime as needed only

## 2016-06-12 ENCOUNTER — Telehealth: Payer: Self-pay | Admitting: *Deleted

## 2016-06-12 MED ORDER — IBUPROFEN 800 MG PO TABS: 800.0000 mg | ORAL_TABLET | Freq: Four times a day (QID) | ORAL | 5 refills | Status: DC | PRN

## 2016-06-12 MED ORDER — MEGESTROL ACETATE 40 MG PO TABS: ORAL_TABLET | ORAL | 0 refills | Status: DC

## 2016-06-12 NOTE — Telephone Encounter (Signed)
Motrin 800 mg one PO TID PRN # 30 refill x 5

## 2016-06-12 NOTE — Telephone Encounter (Signed)
According to my note she was going to start the oral contraceptive pill that the other provider had prescribed. Which she can do is the following: If urine pregnancy test at home is negative give her Megace 40 mg 3 times a day for 3 days then twice a day for 10 days as we get her to come in to the office to schedule a sonohysterogram she should use barrier contraception during that time. She will then hold off on the oral contraceptive pill until evaluation is completed.

## 2016-06-12 NOTE — Telephone Encounter (Signed)
Pt informed with the below note,she did start birth control pills.  she asked about the lower back pain?  She gets back pain with her cycle. Please advise

## 2016-06-12 NOTE — Telephone Encounter (Signed)
Left on voicemail Rx sent 

## 2016-06-12 NOTE — Telephone Encounter (Signed)
Pt cycle started today, just completed a cycle 2 weeks ago and now started again. Pt said the bleeding is starting to get heavy, was prescribe megace 40 mg twice daily for 10 day at office visit on 05/19/16 pt said the megace never worked back in Nov. When she took it for 10 day. Also complaining back lower back took Naproxen but no relief. Please advise

## 2016-06-16 ENCOUNTER — Other Ambulatory Visit: Payer: Self-pay | Admitting: Gynecology

## 2016-06-16 DIAGNOSIS — N939 Abnormal uterine and vaginal bleeding, unspecified: Secondary | ICD-10-CM

## 2016-06-18 ENCOUNTER — Telehealth: Payer: Self-pay | Admitting: *Deleted

## 2016-06-18 NOTE — Telephone Encounter (Signed)
Per [facility] Ref #[id number] [facility] at [facility] copay KW CMA

## 2016-06-21 ENCOUNTER — Encounter: Payer: Self-pay | Admitting: Gynecology

## 2016-06-22 NOTE — Telephone Encounter (Signed)
Dr.JF-Patient is asking how long to continue with Megace tid?  [facility]

## 2016-06-25 ENCOUNTER — Encounter: Payer: Self-pay | Admitting: Gynecology

## 2016-06-26 ENCOUNTER — Telehealth: Payer: Self-pay

## 2016-06-26 ENCOUNTER — Other Ambulatory Visit: Payer: Self-pay | Admitting: Gynecology

## 2016-06-26 MED ORDER — MEGESTROL ACETATE 40 MG PO TABS: ORAL_TABLET | ORAL | 0 refills | Status: DC

## 2016-06-26 NOTE — Telephone Encounter (Signed)
[facility] Mirena IUD benefits. I did make her aware that it would be for menorrhagia diagnosis and I provided that ICD code. She said it is covered with a $25.00 copayment and then it pays 100%. Call Ref #[id number]

## 2016-06-26 NOTE — Telephone Encounter (Signed)
Please note that she is already on Megace TID but bleeding is continuing.

## 2016-06-29 ENCOUNTER — Ambulatory Visit (INDEPENDENT_AMBULATORY_CARE_PROVIDER_SITE_OTHER): Payer: BC Managed Care – PPO | Admitting: Gynecology

## 2016-06-29 ENCOUNTER — Emergency Department (HOSPITAL_COMMUNITY)
Admission: EM | Admit: 2016-06-29 | Discharge: 2016-06-29 | Disposition: A | Discharge status: Home / Self Care | Payer: BC Managed Care – PPO | Attending: Emergency Medicine | Admitting: Emergency Medicine

## 2016-06-29 ENCOUNTER — Emergency Department (HOSPITAL_COMMUNITY): Payer: BC Managed Care – PPO

## 2016-06-29 ENCOUNTER — Ambulatory Visit (INDEPENDENT_AMBULATORY_CARE_PROVIDER_SITE_OTHER): Payer: BC Managed Care – PPO

## 2016-06-29 ENCOUNTER — Encounter: Payer: Self-pay | Admitting: Gynecology

## 2016-06-29 ENCOUNTER — Encounter (HOSPITAL_COMMUNITY): Payer: Self-pay | Admitting: Emergency Medicine

## 2016-06-29 DIAGNOSIS — Z79899 Other long term (current) drug therapy: Secondary | ICD-10-CM | POA: Diagnosis not present

## 2016-06-29 DIAGNOSIS — Y929 Unspecified place or not applicable: Secondary | ICD-10-CM | POA: Diagnosis not present

## 2016-06-29 DIAGNOSIS — W1830XA Fall on same level, unspecified, initial encounter: Secondary | ICD-10-CM | POA: Insufficient documentation

## 2016-06-29 DIAGNOSIS — N939 Abnormal uterine and vaginal bleeding, unspecified: Secondary | ICD-10-CM

## 2016-06-29 DIAGNOSIS — R569 Unspecified convulsions: Secondary | ICD-10-CM

## 2016-06-29 DIAGNOSIS — R930 Abnormal findings on diagnostic imaging of skull and head, not elsewhere classified: Secondary | ICD-10-CM | POA: Insufficient documentation

## 2016-06-29 DIAGNOSIS — Z8742 Personal history of other diseases of the female genital tract: Secondary | ICD-10-CM

## 2016-06-29 DIAGNOSIS — J45909 Unspecified asthma, uncomplicated: Secondary | ICD-10-CM | POA: Insufficient documentation

## 2016-06-29 DIAGNOSIS — Y939 Activity, unspecified: Secondary | ICD-10-CM | POA: Diagnosis not present

## 2016-06-29 DIAGNOSIS — N938 Other specified abnormal uterine and vaginal bleeding: Secondary | ICD-10-CM

## 2016-06-29 DIAGNOSIS — Y999 Unspecified external cause status: Secondary | ICD-10-CM | POA: Diagnosis not present

## 2016-06-29 DIAGNOSIS — F1390 Sedative, hypnotic, or anxiolytic use, unspecified, uncomplicated: Secondary | ICD-10-CM | POA: Diagnosis not present

## 2016-06-29 LAB — COMPREHENSIVE METABOLIC PANEL
ALT: 11 U/L — AB (ref 14–54)
AST: 16 U/L (ref 15–41)
Albumin: 4.1 g/dL (ref 3.5–5.0)
Alkaline Phosphatase: 33 U/L — ABNORMAL LOW (ref 38–126)
Anion gap: 7 (ref 5–15)
BUN: 13 mg/dL (ref 6–20)
CHLORIDE: 111 mmol/L (ref 101–111)
CO2: 23 mmol/L (ref 22–32)
CREATININE: 0.74 mg/dL (ref 0.44–1.00)
Calcium: 9.3 mg/dL (ref 8.9–10.3)
GFR calc Af Amer: >60 mL/min (ref >60)
GFR calc non Af Amer: >60 mL/min (ref >60)
GLUCOSE: 100 mg/dL — AB (ref 65–99)
Potassium: 4.1 mmol/L (ref 3.5–5.1)
SODIUM: 141 mmol/L (ref 135–145)
Total Bilirubin: 0.6 mg/dL (ref 0.3–1.2)
Total Protein: 6.6 g/dL (ref 6.5–8.1)

## 2016-06-29 LAB — CBC
HCT: 36.6 % (ref 36.0–46.0)
Hemoglobin: 12 g/dL (ref 12.0–15.0)
MCH: 26.7 pg (ref 26.0–34.0)
MCHC: 32.8 g/dL (ref 30.0–36.0)
MCV: 81.3 fL (ref 78.0–100.0)
PLATELETS: 199 10*3/uL (ref 150–400)
RBC: 4.5 MIL/uL (ref 3.87–5.11)
RDW: 13.1 % (ref 11.5–15.5)
WBC: 4.2 10*3/uL (ref 4.0–10.5)

## 2016-06-29 LAB — ETHANOL

## 2016-06-29 LAB — I-STAT BETA HCG BLOOD, ED (MC, WL, AP ONLY): I-stat hCG, quantitative: <5 m[IU]/mL (ref <5)

## 2016-06-29 LAB — RAPID URINE DRUG SCREEN, HOSP PERFORMED
Amphetamines: NOT DETECTED
BARBITURATES: NOT DETECTED
Benzodiazepines: POSITIVE — AB
Cocaine: NOT DETECTED
Opiates: NOT DETECTED
TETRAHYDROCANNABINOL: NOT DETECTED

## 2016-06-29 MED ORDER — SODIUM CHLORIDE 0.9 % IV BOLUS (SEPSIS)
1000.0000 mL | Freq: Once | INTRAVENOUS | Status: AC
  Administered 2016-06-29: 1000 mL via INTRAVENOUS

## 2016-06-29 NOTE — ED Notes (Signed)
Assisted up to br did well ua to lab

## 2016-06-29 NOTE — ED Notes (Signed)
Rails wrapped w/ blankets for sz pads

## 2016-06-29 NOTE — ED Notes (Signed)
Pt aaa x 4 no , states they were doing procedure and  She had blood clot in her vagina and they sucked it out and it hurt, she then got up to go to BR and woke up on floor

## 2016-06-29 NOTE — ED Notes (Signed)
Dr ray into speak to pt and tell her she cannot drive or be around deep water  Or do anything that may endanger her lfe if she has another sz

## 2016-06-29 NOTE — Discharge Instructions (Signed)
Do not drive or do anything which may cause you or others harm if you have a seizure Follow-up with your neurologist tomorrow as scheduled.

## 2016-06-29 NOTE — ED Notes (Signed)
Back from ct.
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
## 2016-06-30 ENCOUNTER — Telehealth: Payer: Self-pay | Admitting: Gynecology

## 2016-06-30 ENCOUNTER — Encounter: Payer: Self-pay | Admitting: *Deleted

## 2016-06-30 ENCOUNTER — Encounter: Payer: Self-pay | Admitting: Gynecology

## 2016-06-30 ENCOUNTER — Telehealth: Payer: Self-pay

## 2016-06-30 ENCOUNTER — Ambulatory Visit (INDEPENDENT_AMBULATORY_CARE_PROVIDER_SITE_OTHER): Payer: BC Managed Care – PPO | Admitting: Neurology

## 2016-06-30 DIAGNOSIS — R202 Paresthesia of skin: Secondary | ICD-10-CM

## 2016-06-30 DIAGNOSIS — G5622 Lesion of ulnar nerve, left upper limb: Secondary | ICD-10-CM | POA: Diagnosis not present

## 2016-06-30 NOTE — Procedures (Signed)
[facility]  [address], [address]  [place], [place] [address] Tel: [phone] Fax:  [phone] Test Date:  06/30/2016  Patient: Candace Stephens DOB: 20-Mar-1986 Physician: Nita Sickleonika Jalise Zawistowski, DO  Sex: Female Height: 5\' 3"  Ref Phys: Nita Sickleonika Kamea Dacosta, DO  ID#: [id number] Temp: 35.0C Technician: Judie PetitM. Dean   Patient Complaints: This is a 30 year old female referred for evaluation of left hand numbness and weakness.   NCV & EMG Findings: Extensive electrodiagnostic testing of the left upper extremity shows:  1. Left median, ulnar, and mixed palmar sensory responses are within normal limits.  2. Left median and ulnar motor responses are within normal limits. 3. There is no evidence of active or chronic motor axon loss changes affecting any of the tested muscles. Motor unit configuration and recruitment pattern is within normal limits.  Impression: This is a normal study of the left upper extremity. In particular, there is no evidence of carpal tunnel syndrome, ulnar neuropathy, or a cervical radiculopathy.   ___________________________ Nita Sickleonika Aran Menning, DO    Nerve Conduction Studies Anti Sensory Summary Table   Site NR Peak (ms) Norm Peak (ms) P-T Amp (V) Norm P-T Amp  Left Median Anti Sensory (2nd Digit)  35C  Wrist    3.1 <3.3 23.1 >20  Left Ulnar Anti Sensory (5th Digit)  35C  Wrist    2.8 <3.0 23.1 >18   Motor Summary Table   Site NR Onset (ms) Norm Onset (ms) O-P Amp (mV) Norm O-P Amp Site1 Site2 Delta-0 (ms) Dist (cm) Vel (m/s) Norm Vel (m/s)  Left Median Motor (Abd Poll Brev)  35C  Wrist    3.6 <3.9 13.9 >6 Elbow Wrist 3.6 21.0 58 >51  Elbow    7.2  12.9         Left Ulnar Motor (Abd Dig Minimi)  35C  Wrist    2.5 <3.0 9.5 >8 B Elbow Wrist 3.0 20.0 67 >51  B Elbow    5.5  8.7  A Elbow B Elbow 1.5 10.0 67 >51  A Elbow    7.0  8.7          Comparison Summary Table   Site NR Peak (ms) Norm Peak (ms) P-T Amp (V) Site1 Site2 Delta-P (ms) Norm Delta (ms)  Left  Median/Ulnar Palm Comparison (Wrist - 8cm)  35C  Median Palm    1.8 <2.2 50.9 Median Palm Ulnar Palm 0.1   Ulnar Palm    1.9 <2.2 9.7       EMG   Side Muscle Ins Act Fibs Psw Fasc Number Recrt Dur Dur. Amp Amp. Poly Poly. Comment  Left 1stDorInt Nml Nml Nml Nml Nml Nml Nml Nml Nml Nml Nml Nml N/A  Left Ext Indicis Nml Nml Nml Nml Nml Nml Nml Nml Nml Nml Nml Nml N/A  Left PronatorTeres Nml Nml Nml Nml Nml Nml Nml Nml Nml Nml Nml Nml N/A  Left Biceps Nml Nml Nml Nml Nml Nml Nml Nml Nml Nml Nml Nml N/A  Left Triceps Nml Nml Nml Nml Nml Nml Nml Nml Nml Nml Nml Nml N/A  Left Deltoid Nml Nml Nml Nml Nml Nml Nml Nml Nml Nml Nml Nml N/A      Waveforms:

## 2016-06-30 NOTE — Telephone Encounter (Signed)
[facility] Mirena IUD covered at 100% with a $25 copayment. No precert req'd. Lupita LeashDonna will call patient to schedule and let her know.

## 2016-06-30 NOTE — Telephone Encounter (Signed)
Dr. Glenetta HewJF- are you still okay with her proceeding with insertion of Mirena IUD.  We will get her appt rescheduled if so.

## 2016-06-30 NOTE — Telephone Encounter (Signed)
Call to check on Candace Stephens after her visit for her ultrasound exam. Left a message that I was calling to check on her and that I hoped she was feeling better today.

## 2016-07-01 ENCOUNTER — Other Ambulatory Visit: Payer: Self-pay | Admitting: *Deleted

## 2016-07-01 ENCOUNTER — Ambulatory Visit (INDEPENDENT_AMBULATORY_CARE_PROVIDER_SITE_OTHER): Payer: BC Managed Care – PPO | Admitting: Neurology

## 2016-07-01 DIAGNOSIS — R569 Unspecified convulsions: Secondary | ICD-10-CM

## 2016-07-01 NOTE — Procedures (Signed)
ELECTROENCEPHALOGRAM REPORT  Date of Study: 07/01/2016  Patient's Name: Candace Stephens MRN: [id number] Date of Birth: 1986-05-25  Referring Provider: Dr. Nita Sickleonika Patel  Clinical History: This is a 30 year old woman with new onset seizures.  Medications: Proventil, Ventolin, Flexeril, Voltaren, Flonace, Megace, Advil, Reglan, Ortho-Cyclen  Technical Summary: A multichannel digital EEG recording measured by the international 10-20 system with electrodes applied with paste and impedances below 5000 ohms performed in our laboratory with EKG monitoring in an awake and drowsy patient.  Hyperventilation and photic stimulation were performed.  The digital EEG was referentially recorded, reformatted, and digitally filtered in a variety of bipolar and referential montages for optimal display.    Description: The patient is awake and drowsy during the recording.  During maximal wakefulness, there is a symmetric, medium voltage 10 Hz posterior dominant rhythm that attenuates with eye opening.  The record is symmetric.  During drowsiness, there is an increase in theta slowing of the background.  Deeper stages of sleep were not seen. Hyperventilation and photic stimulation did not elicit any abnormalities.  There were no epileptiform discharges or electrographic seizures seen.    EKG lead was unremarkable.  Impression: This awake and drowsy EEG is normal.    Clinical Correlation: A normal EEG does not exclude a clinical diagnosis of epilepsy.  If further clinical questions remain, prolonged EEG may be helpful.  Clinical correlation is advised.   Patrcia DollyKaren Carel Carrier, M.D.

## 2016-07-02 ENCOUNTER — Encounter: Payer: Self-pay | Admitting: Neurology

## 2016-07-02 ENCOUNTER — Other Ambulatory Visit: Payer: Self-pay | Admitting: Neurology

## 2016-07-02 ENCOUNTER — Ambulatory Visit (INDEPENDENT_AMBULATORY_CARE_PROVIDER_SITE_OTHER): Payer: BC Managed Care – PPO | Admitting: Neurology

## 2016-07-02 VITALS — BP 130/80 | HR 102 | Ht 64.0 in | Wt 134.4 lb

## 2016-07-02 DIAGNOSIS — G5622 Lesion of ulnar nerve, left upper limb: Secondary | ICD-10-CM

## 2016-07-02 DIAGNOSIS — G238 Other specified degenerative diseases of basal ganglia: Secondary | ICD-10-CM | POA: Diagnosis not present

## 2016-07-02 DIAGNOSIS — G40909 Epilepsy, unspecified, not intractable, without status epilepticus: Secondary | ICD-10-CM | POA: Diagnosis not present

## 2016-07-02 LAB — MAGNESIUM: Magnesium: 1.8 mg/dL (ref 1.5–2.5)

## 2016-07-02 MED ORDER — LEVETIRACETAM 500 MG PO TABS
500.0000 mg | ORAL_TABLET | Freq: Two times a day (BID) | ORAL | 5 refills | Status: DC
Start: 2016-07-02 — End: 2016-07-10

## 2016-07-02 NOTE — Patient Instructions (Addendum)
1.  Start keppra 500mg  twice daily 2.  MRI brain  3.  Check blood work 4.  Recommend waiting 30-days from last seizure prior to having Mirena implanted  Return to clinic 4 months  Instructions regarding seizure disorders:   1. If medication has been prescribed for you to prevent seizures, take it exactly as directed.  Do not stop taking the medicine without talking to your doctor first, even if you have not had a seizure in a long time.   2. Avoid activities in which a seizure would cause danger to yourself or to others.  Don't operate dangerous machinery, swim alone, or climb in high or dangerous places, such as on ladders, roofs, or girders.  Do not drive unless your doctor says you may.  3. Wear an emergency medical identification bracelet with information about your seizure.  If you have a seizure, people around you will know what is wrong and get appropriate help.  4. If you have any warning that you may have a seizure, lay down in a safe place where you can't hurt yourself.    5. Do not drink alcohol or take illegal drugs.  These can make you more susceptible to having seizures.  6. Notify your neurology if you are planning pregnancy or if you become pregnant.  CONTACT YOUR DOCTOR IF: You have any problems that may be related to the medicine you are taking.  Teach your family, close friends, or co-workers what to do if you have a seizure: 1. Stay calm.  Keep the person from falling onto harmful objects.  Move hard or sharp objects out of the way.  2. Don't force anything into the person's mouth or try to open clenched jaws.  Turn the person on his or her side when the violent movement stops or if he or she is vomiting.  3. When the seizure is over, the person may be confused or drowsy.  Reassure the person that he or she is all right.  Help him or her to relax.  4. Call 911 and bring the patient back to the ED if:      a. The patient doesn't awaken shortly after the seizure   b. The patient has new problems such as difficulty seeing, speaking or moving      c. The patient was injured during the seizure      d. The patient has a temperature over 102 F (39C)      e. The patient vomited and now is having trouble breathing
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
## 2016-07-03 LAB — PARATHYROID HORMONE, INTACT (NO CA): PTH: 30 pg/mL (ref 14–64)

## 2016-07-05 LAB — HEAVY METALS PANEL, BLOOD
ARSENIC: 7 ug/L (ref <23)
Lead: <1 ug/dL (ref <5)

## 2016-07-09 ENCOUNTER — Encounter: Payer: Self-pay | Admitting: Neurology

## 2016-07-10 ENCOUNTER — Other Ambulatory Visit: Payer: Self-pay | Admitting: Neurology

## 2016-07-10 ENCOUNTER — Encounter: Payer: Self-pay | Admitting: Neurology

## 2016-07-10 MED ORDER — LEVETIRACETAM ER 500 MG PO TB24: 1000.0000 mg | ORAL_TABLET | Freq: Every day | ORAL | 11 refills | Status: DC

## 2016-07-16 ENCOUNTER — Ambulatory Visit
Admission: RE | Admit: 2016-07-16 | Discharge: 2016-07-16 | Disposition: A | Discharge status: Home / Self Care | Payer: BC Managed Care – PPO | Source: Ambulatory Visit | Attending: Neurology | Admitting: Neurology

## 2016-07-16 ENCOUNTER — Encounter: Payer: Self-pay | Admitting: Neurology

## 2016-07-16 DIAGNOSIS — G5622 Lesion of ulnar nerve, left upper limb: Secondary | ICD-10-CM

## 2016-07-16 DIAGNOSIS — G238 Other specified degenerative diseases of basal ganglia: Secondary | ICD-10-CM

## 2016-07-16 DIAGNOSIS — G40909 Epilepsy, unspecified, not intractable, without status epilepticus: Secondary | ICD-10-CM

## 2016-07-16 MED ORDER — GADOBENATE DIMEGLUMINE 529 MG/ML IV SOLN
10.0000 mL | Freq: Once | INTRAVENOUS | Status: AC | PRN
  Administered 2016-07-16: 10 mL via INTRAVENOUS

## 2016-07-29 ENCOUNTER — Encounter: Payer: Self-pay | Admitting: Neurology

## 2016-08-13 ENCOUNTER — Ambulatory Visit (INDEPENDENT_AMBULATORY_CARE_PROVIDER_SITE_OTHER): Payer: BC Managed Care – PPO | Admitting: Neurology

## 2016-08-13 ENCOUNTER — Encounter: Payer: Self-pay | Admitting: Neurology

## 2016-08-13 VITALS — BP 122/76 | HR 90 | Ht 64.0 in | Wt 131.1 lb

## 2016-08-13 DIAGNOSIS — G40909 Epilepsy, unspecified, not intractable, without status epilepticus: Secondary | ICD-10-CM

## 2016-08-13 DIAGNOSIS — G43019 Migraine without aura, intractable, without status migrainosus: Secondary | ICD-10-CM

## 2016-08-13 DIAGNOSIS — G238 Other specified degenerative diseases of basal ganglia: Secondary | ICD-10-CM

## 2016-08-13 MED ORDER — KETOROLAC TROMETHAMINE 60 MG/2ML IM SOLN
30.0000 mg | Freq: Once | INTRAMUSCULAR | Status: AC
  Administered 2016-08-13: 30 mg via INTRAMUSCULAR

## 2016-08-13 MED ORDER — DIPHENHYDRAMINE HCL 50 MG/ML IJ SOLN
25.0000 mg | Freq: Once | INTRAMUSCULAR | Status: AC
  Administered 2016-08-13: 25 mg via INTRAVENOUS

## 2016-08-13 MED ORDER — PREDNISONE 10 MG PO TABS: ORAL_TABLET | ORAL | 0 refills | Status: DC

## 2016-08-13 NOTE — Patient Instructions (Addendum)
1.  Start prednisone 60mg  on day 1, then 50mg  on day 2, then continue to reduce by 1 tablet each day 2.  Send me an update in 1-2 weeks.
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
## 2016-08-17 ENCOUNTER — Encounter: Payer: Self-pay | Admitting: Neurology

## 2016-08-17 MED ORDER — SUMATRIPTAN SUCCINATE 50 MG PO TABS: ORAL_TABLET | ORAL | 0 refills | Status: DC

## 2016-08-19 ENCOUNTER — Encounter: Payer: Self-pay | Admitting: Gynecology

## 2016-08-19 ENCOUNTER — Ambulatory Visit (INDEPENDENT_AMBULATORY_CARE_PROVIDER_SITE_OTHER): Payer: BC Managed Care – PPO | Admitting: Gynecology

## 2016-08-19 ENCOUNTER — Other Ambulatory Visit: Payer: Self-pay | Admitting: Gynecology

## 2016-08-19 ENCOUNTER — Ambulatory Visit (INDEPENDENT_AMBULATORY_CARE_PROVIDER_SITE_OTHER): Payer: BC Managed Care – PPO

## 2016-08-19 VITALS — BP 122/80 | Ht 64.0 in | Wt 131.0 lb

## 2016-08-19 DIAGNOSIS — N920 Excessive and frequent menstruation with regular cycle: Secondary | ICD-10-CM

## 2016-08-19 DIAGNOSIS — N831 Corpus luteum cyst of ovary, unspecified side: Secondary | ICD-10-CM

## 2016-08-19 DIAGNOSIS — N83202 Unspecified ovarian cyst, left side: Secondary | ICD-10-CM

## 2016-08-19 DIAGNOSIS — N83201 Unspecified ovarian cyst, right side: Secondary | ICD-10-CM | POA: Diagnosis not present

## 2016-08-19 DIAGNOSIS — Z8742 Personal history of other diseases of the female genital tract: Secondary | ICD-10-CM | POA: Diagnosis not present

## 2016-08-19 NOTE — Progress Notes (Signed)
   Patient is a 31 year old that presented to the office for an ultrasound to follow-up on ovarian cyst. Patient was last seen the office on December 18 for an ultrasound and sonohysterogram as part of her evaluation for her menorrhagia. At that they her ultrasound demonstrated the following:  Ultrasound/sono hysterogram today: Uterus measuring 9.0 x 5.9 x 4.3 cm endometrial stripe 13 mm. Right ovarian cyst previously seen not present left ovarian follicle 9 x 8 mm with internal low level echoes negative color flow. No fluid in the cul-de-sac. The cervix was then cleansed with Betadine solution and a sterile catheter was introduced into the uterine cavity and normal saline was instilled a small blood clot was noted which was then retrieved by one single pass with the Vabra aspiration device. Her pathology report was benign with no evidence hyperplasia.  A few minutes after the procedure was completed patient set up and was getting ready to change clothes and she began experiencing what appeared to be seizure with tonic/clonic contractions. Her legs were elevated she was given and a moderately in her late. She was given a back to rebreather and the event it was a panic attack. Her blood pressure was 150 10/12/2002 and pulse of 92. EMS was notified and she had several witness seizures and an IV line was started and her blood sugar was 127 and she was given 5 mg of Versed which helped calm her down and she was taken to the [facility]. Tissue obtained submitted for histological evaluation. The original plan was for patient to stay on the Megace 40 mg 3 times a day to slow down her bleeding and return later for placement of Mirena IUD.  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 Patient's currently on Keppra exercise thousand  milligrams daily and also recently started on Imitrex. For migraine headaches.  GYN pelvic ultrasound today: Uterus measured 10.0 x 6.7 x 4.9 cm with endometrial stripe of 9.3 mm. Last menstrual cycle approximately 3 weeks ago. A small collapsed right corpus luteum cyst measuring 23 x 13 x 19 mm with positive color flow the periphery was noted. Left ovary was normal. Small amount of fluid in the cul-de-sac was reported.  Assessment/plan: Normal ultrasound today. Patient seizure activity and control after evaluation and treatment by neurologist. Patient will return to the office at the onset of her menstrual cycle in the next couple weeks for placement of the Mirena IUD for menorrhagia as well as for contraception. She will need also a Pap smear.

## 2016-08-25 ENCOUNTER — Encounter: Payer: Self-pay | Admitting: Gynecology

## 2016-08-25 ENCOUNTER — Ambulatory Visit (INDEPENDENT_AMBULATORY_CARE_PROVIDER_SITE_OTHER): Payer: BC Managed Care – PPO | Admitting: Gynecology

## 2016-08-25 VITALS — BP 126/80 | Ht 64.0 in | Wt 131.0 lb

## 2016-08-25 DIAGNOSIS — Z30430 Encounter for insertion of intrauterine contraceptive device: Secondary | ICD-10-CM | POA: Diagnosis not present

## 2016-08-25 NOTE — Progress Notes (Signed)
Patient is a 31 year old who presented to the office today for placement of Mirena IUD as a result of her menorrhagia for contraception. Her history is as follows:  Patient was seen in the office on December 18 to follow-up on ovarian cyst. As follows: Ultrasound/sono hysterogram today: Uterus measuring 9.0 x 5.9 x 4.3 cm endometrial stripe 13 mm. Right ovarian cyst previously seen not present left ovarian follicle 9 x 8 mm with internal low level echoes negative color flow. No fluid in the cul-de-sac. The cervix was then cleansed with Betadine solution and a sterile catheter was introduced into the uterine cavity and normal saline was instilled a small blood clot was noted which was then retrieved by one single pass with the Vabra aspiration device. Her pathology report was benign with no evidence hyperplasia.  A few minutes after the procedure was completed patient set up and was getting ready to change clothes and she began experiencing what appeared to be seizure with tonic/clonic contractions. Her legs were elevated she was given and a moderately in her late. She was given a back to rebreather and the event it was a panic attack. Her blood pressure was 150 10/12/2002 and pulse of 92. EMS was notified and she had several witness seizures and an IV line was started and her blood sugar was 127 and she was given 5 mg of Versed which helped calm her down and she was taken to [facility]. Tissue obtained submitted for histological evaluation.The original plan was for patient to stay on the Megace 40 mg 3 times a day to slow down her bleeding and return later for placement of Mirena IUD.  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 Patient's currently on Keppra  500 milligrams 2 tablets daily and also recently started  on Imitrex. For migraine headaches.  GYN pelvic ultrasound 08/19/2016: Uterus measured 10.0 x 6.7 x 4.9 cm with endometrial stripe of 9.3 mm. Last menstrual cycle approximately 3 weeks ago. A small collapsed right corpus luteum cyst measuring 23 x 13 x 19 mm with positive color flow the periphery was noted. Left ovary was normal. Small amount of fluid in the cul-de-sac was reported.  She is here for placement of the Mirena IUD for menorrhagia and contraception:                                                                    IUD procedure note       Patient presented to the office today for placement of Mirena IUD. The patient had previously been provided with literature information on this method of contraception. The risks benefits and pros and cons were discussed and all her questions were answered. She is fully aware that this form of contraception is 99% effective and is good for 5 years.  Pelvic exam: Bartholin urethra Skene glands: Within normal limits Vagina: No lesions or discharge Cervix: No lesions or discharge Uterus: Anteverted position Adnexa: No masses or tenderness Rectal exam: Not done  The cervix was cleansed with Betadine solution. A single-tooth tenaculum was placed on the anterior cervical lip. The  uterus sounded to 7-1/2 centimeter. The IUD was shown to the patient and inserted in a sterile fashion. The IUD string was trimmed. The single-tooth tenaculum was removed. Patient was instructed to return back to the office in one month for follow up.

## 2016-08-25 NOTE — Patient Instructions (Signed)

## 2016-08-27 ENCOUNTER — Ambulatory Visit: Payer: BC Managed Care – PPO | Admitting: Gynecology

## 2016-08-27 ENCOUNTER — Encounter: Payer: Self-pay | Admitting: Neurology

## 2016-09-01 ENCOUNTER — Telehealth: Payer: Self-pay | Admitting: Neurology

## 2016-09-01 MED ORDER — TOPIRAMATE 25 MG PO TABS: ORAL_TABLET | ORAL | 5 refills | Status: DC

## 2016-09-01 NOTE — Telephone Encounter (Signed)
Called patients and discussed her symptoms.  Headaches are occurring daily and responsive to imitrex, but she has already taken it twice this week.  She will start topiramate 25mg  daily x 2 weeks, then increase to 50mg  daily.  She is also having what sounds like hemifacial spasm.  I will have her f/u appointment moved sooner to evaluate her.  Donika K. Allena KatzPatel, DO

## 2016-09-07 ENCOUNTER — Encounter: Payer: Self-pay | Admitting: Gynecology

## 2016-09-18 ENCOUNTER — Telehealth: Payer: Self-pay | Admitting: Neurology

## 2016-09-18 NOTE — Telephone Encounter (Signed)
Please advise 

## 2016-09-18 NOTE — Telephone Encounter (Signed)
PT called and said the medication she is taking causes kidney stones and she thinks that is what she has because she is in a lot of pain/Dawn CB# [phone]

## 2016-09-18 NOTE — Telephone Encounter (Signed)
Patient given instructions per Dr. Patel. 

## 2016-09-18 NOTE — Telephone Encounter (Signed)
I'm sorry to hear this.  This can be a side effect of topiramate, so let's stop the medication.  She should drink plenty of water and follow-up with her PCP, if her pain continues.

## 2016-09-22 ENCOUNTER — Ambulatory Visit: Payer: BC Managed Care – PPO | Admitting: Gynecology

## 2016-09-22 ENCOUNTER — Encounter: Payer: Self-pay | Admitting: Gynecology

## 2016-09-22 ENCOUNTER — Ambulatory Visit (INDEPENDENT_AMBULATORY_CARE_PROVIDER_SITE_OTHER): Payer: BC Managed Care – PPO | Admitting: Gynecology

## 2016-09-22 VITALS — BP 110/70

## 2016-09-22 DIAGNOSIS — Z30431 Encounter for routine checking of intrauterine contraceptive device: Secondary | ICD-10-CM

## 2016-09-22 NOTE — Progress Notes (Signed)
   Patient presented to the office today for her 1 month follow-up after having placed the Mirena IUD. Her history is as follows:  Patient was seen in the office on December 18 to follow-up on ovarian cyst. As follows: Ultrasound/sono hysterogram today: Uterus measuring 9.0 x 5.9 x 4.3 cm endometrial stripe 13 mm. Right ovarian cyst previously seen not present left ovarian follicle 9 x 8 mm with internal low level echoes negative color flow. No fluid in the cul-de-sac. The cervix was then cleansed with Betadine solution and a sterile catheter was introduced into the uterine cavity and normal saline was instilled a small blood clot was noted which was then retrieved by one single pass with the Vabra aspiration device.Her pathology report was benign with no evidence hyperplasia.  A few minutes after the procedure was completed patient set up and was getting ready to change clothes and she began experiencing what appeared to be seizure with tonic/clonic contractions. Her legs were elevated she was given and a moderately in her late. She was given a back to rebreather and the event it was a panic attack. Her blood pressure was 150 10/12/2002 and pulse of 92. EMS was notified and she had several witness seizures and an IV line was started and her blood sugar was 127 and she was given 5 mg of Versed which helped calm her down and she was taken to the emergency room at [facility]. Tissue obtained submitted for histological evaluation.The original plan was for patient to stay on the Megace 40 mg 3 times a day to slow down her bleeding and return later for placement of Mirena IUD.  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 Patient's currently on Keppra  500 milligrams 2 tablets daily and also recently started on Imitrex. For migraine  headaches.  GYN pelvic ultrasound 08/19/2016: Uterus measured 10.0 x 6.7 x 4.9 cm with endometrial stripe of 9.3 mm. Last menstrual cycle approximately 3 weeks ago. A small collapsed right corpus luteum cyst measuring 23 x 13 x 19 mm with positive color flow the periphery was noted. Left ovary was normal. Small amount of fluid in the cul-de-sac was reported.  Exam: Abdomen: Soft nontender no rebound or guarding Pelvic: In urethra Skene was within normal limits Vagina: Menstrual blood patient currently on menstrual cycle Cervix: IUD string not visualized Uterus: Anteverted normal size shape and consistency Adnexa: No palpable masses or tenderness Rectal exam not done  Assessment/plan: Patient one month status post placement of Mirena IUD will return back to the office within a week for an ultrasound to confirm that the IUD still in the uterine cavity since the IUD string was not visualized today.

## 2016-09-23 ENCOUNTER — Ambulatory Visit (INDEPENDENT_AMBULATORY_CARE_PROVIDER_SITE_OTHER): Payer: BC Managed Care – PPO

## 2016-09-23 ENCOUNTER — Ambulatory Visit (INDEPENDENT_AMBULATORY_CARE_PROVIDER_SITE_OTHER): Payer: BC Managed Care – PPO | Admitting: Gynecology

## 2016-09-23 ENCOUNTER — Other Ambulatory Visit: Payer: Self-pay | Admitting: Gynecology

## 2016-09-23 ENCOUNTER — Encounter: Payer: Self-pay | Admitting: Gynecology

## 2016-09-23 DIAGNOSIS — N838 Other noninflammatory disorders of ovary, fallopian tube and broad ligament: Secondary | ICD-10-CM

## 2016-09-23 DIAGNOSIS — N839 Noninflammatory disorder of ovary, fallopian tube and broad ligament, unspecified: Secondary | ICD-10-CM

## 2016-09-23 DIAGNOSIS — T8389XS Other specified complication of genitourinary prosthetic devices, implants and grafts, sequela: Secondary | ICD-10-CM

## 2016-09-23 DIAGNOSIS — T8332XS Displacement of intrauterine contraceptive device, sequela: Secondary | ICD-10-CM

## 2016-09-23 DIAGNOSIS — N83201 Unspecified ovarian cyst, right side: Secondary | ICD-10-CM

## 2016-09-23 DIAGNOSIS — Z30431 Encounter for routine checking of intrauterine contraceptive device: Secondary | ICD-10-CM | POA: Diagnosis not present

## 2016-09-23 NOTE — Progress Notes (Signed)
   Patient presented to office today for an ultrasound due to the fact that she was seen in the office yesterday and the IUD that was placed month ago the string was not visualized.  Review of her record indicated the following:Patient was seen in the office on December 18 to follow-up on ovarian cyst. As follows: Ultrasound/sono hysterogram today: Uterus measuring 9.0 x 5.9 x 4.3 cm endometrial stripe 13 mm. Right ovarian cyst previously seen not present left ovarian follicle 9 x 8 mm with internal low level echoes negative color flow. No fluid in the cul-de-sac. The cervix was then cleansed with Betadine solution and a sterile catheter was introduced into the uterine cavity and normal saline was instilled a small blood clot was noted which was then retrieved by one single pass with the Vabra aspiration device.Her pathology report was benign with no evidence hyperplasia.  A few minutes after the procedure was completed patient set up and was getting ready to change clothes and she began experiencing what appeared to be seizure with tonic/clonic contractions. Her legs were elevated she was given and a moderately in her late. She was given a back to rebreather and the event it was a panic attack. Her blood pressure was 150 10/12/2002 and pulse of 92. EMS was notified and she had several witness seizures and an IV line was started and her blood sugar was 127 and she was given 5 mg of Versed which helped calm her down and she was taken to [facility]. Tissue obtained submitted for histological evaluation.The original plan was for patient to stay on the Megace 40 mg 3 times a day to slow down her bleeding and return later for placement of Mirena IUD.  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 Patient's currently on  Keppra 500milligrams 2 tabletsdaily and also recently started on Imitrex. For migraine headaches.  GYN pelvic ultrasound02/01/2017: Uterus measured 10.0 x 6.7 x 4.9 cm with endometrial stripe of 9.3 mm. Last menstrual cycle approximately 3 weeks ago. A small collapsed right corpus luteum cyst measuring 23 x 13 x 19 mm with positive color flow the periphery was noted. Left ovary was normal. Small amount of fluid in the cul-de-sac was reported.  On February 13 she had a Mirena IUD placed.  Ultrasound today: Uterus measured 8.9 x 5.4 x 3.4 cm with an millimeters stripe of 3 mm. IUD was seen in the normal position. Free fluid in the cul-de-sac was noted 26 x 17 mm. 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 Left ovary was normal.  Assessment/plan ultrasound today to confirm IUD in the appropriate position which was noted to be. Incidental finding of right ovarian cyst will be followed with an ultrasound in 3 months. A CA 125 will be drawn today. Literature information on ovarian cyst was provided.

## 2016-09-23 NOTE — Patient Instructions (Signed)
CA-125 Tumor Marker Test Why am I having this test? This test is used to check the level of cancer antigen 125 (CA-125) in your blood. The CA-125 tumor marker test can be helpful in detecting ovarian cancer. The test is only performed if you are considered at high risk for ovarian cancer. Your health care provider may recommend this test if:  You have a strong family history of ovarian cancer.  You have a breast cancer antigen (BRCA) genetic defect. If you have already been diagnosed with ovarian cancer, your health care provider may use this test to help identify the extent of the disease and to monitor your response to treatment. What kind of sample is taken? A blood sample is required for this test. It is usually collected by inserting a needle into a vein. How do I prepare for this test? There is no preparation required for this test. What are the reference ranges? Reference ranges are considered healthy ranges established after testing a large group of healthy people. Reference ranges may vary among different people, labs, and hospitals. It is your responsibility to obtain your test results. Ask the lab or department performing the test when and how you will get your results. The reference range for this test is 0-35 units/mL or less than 35 kunits/L (SI units). What do the results mean? Increased levels of CA-125 may indicate:  Certain types of cancer, including:  Ovarian cancer.  Pancreatic cancer.  Colon cancer.  Lung cancer.  Breast cancer.  Lymphoma.  Noncancerous (benign) disorders, including:  Cirrhosis.  Pregnancy.  Endometriosis.  Pancreatitis.  Pelvic inflammatory disease (PID). Talk with your health care provider to discuss your results, treatment options, and if necessary, the need for more tests. Talk with your health care provider if you have any questions about your results. Talk with your health care provider to discuss your results, treatment options,  and if necessary, the need for more tests. Talk with your health care provider if you have any questions about your results. This information is not intended to replace advice given to you by your health care provider. Make sure you discuss any questions you have with your health care provider. Document Released: 07/21/2004 Document Revised: 03/03/2016 Document Reviewed: 11/16/2013 Elsevier Interactive Patient Education  2017 [place]. Ovarian Cyst  An ovarian cyst is a fluid-filled sac that forms on an ovary. The ovaries are small organs that produce eggs in women. Various types of cysts can form on the ovaries. Some may cause symptoms and require treatment. Most ovarian cysts go away on their own, are not cancerous (are benign), and do not cause problems. Common types of ovarian cysts include:  Functional (follicle) cysts.  Occur during the menstrual cycle, and usually go away with the next menstrual cycle if you do not get pregnant.  Usually cause no symptoms.  Endometriomas.  Are cysts that form from the tissue that lines the uterus (endometrium).  Are sometimes called "chocolate cysts" because they become filled with blood that turns brown.  Can cause pain in the lower abdomen during intercourse and during your period.  Cystadenoma cysts.  Develop from cells on the outside surface of the ovary.  Can get very large and cause lower abdomen pain and pain with intercourse.  Can cause severe pain if they twist or break open (rupture).  Dermoid cysts.  Are sometimes found in both ovaries.  May contain different kinds of body tissue, such as skin, teeth, hair, or cartilage.  Usually do not  cause symptoms unless they get very big.  Theca lutein cysts.  Occur when too much of a certain hormone (human chorionic gonadotropin) is produced and overstimulates the ovaries to produce an egg.  Are most common after having procedures used to assist with the conception of a baby (in  vitro fertilization). What are the causes? Ovarian cysts may be caused by:  Ovarian hyperstimulation syndrome. This is a condition that can develop from taking fertility medicines. It causes multiple large ovarian cysts to form.  Polycystic ovarian syndrome (PCOS). This is a common hormonal disorder that can cause ovarian cysts, as well as problems with your period or fertility. What increases the risk? The following factors may make you more likely to develop ovarian cysts:  Being overweight or obese.  Taking fertility medicines.  Taking certain forms of hormonal birth control.  Smoking. What are the signs or symptoms? Many ovarian cysts do not cause symptoms. If symptoms are present, they may include:  Pelvic pain or pressure.  Pain in the lower abdomen.  Pain during sex.  Abdominal swelling.  Abnormal menstrual periods.  Increasing pain with menstrual periods. How is this diagnosed? These cysts are commonly found during a routine pelvic exam. You may have tests to find out more about the cyst, such as:  Ultrasound.  X-ray of the pelvis.  CT scan.  MRI.  Blood tests. How is this treated? Many ovarian cysts go away on their own without treatment. Your health care provider may want to check your cyst regularly for 2-3 months to see if it changes. If you are in menopause, it is especially important to have your cyst monitored closely because menopausal women have a higher rate of ovarian cancer. When treatment is needed, it may include:  Medicines to help relieve pain.  A procedure to drain the cyst (aspiration).  Surgery to remove the whole cyst.  Hormone treatment or birth control pills. These methods are sometimes used to help dissolve a cyst. Follow these instructions at home:  Take over-the-counter and prescription medicines only as told by your health care provider.  Do not drive or use heavy machinery while taking prescription pain medicine.  Get  regular pelvic exams and Pap tests as often as told by your health care provider.  Return to your normal activities as told by your health care provider. Ask your health care provider what activities are safe for you.  Do not use any products that contain nicotine or tobacco, such as cigarettes and e-cigarettes. If you need help quitting, ask your health care provider.  Keep all follow-up visits as told by your health care provider. This is important. Contact a health care provider if:  Your periods are late, irregular, or painful, or they stop.  You have pelvic pain that does not go away.  You have pressure on your bladder or trouble emptying your bladder completely.  You have pain during sex.  You have any of the following in your abdomen:  A feeling of fullness.  Pressure.  Discomfort.  Pain that does not go away.  Swelling.  You feel generally ill.  You become constipated.  You lose your appetite.  You develop severe acne.  You start to have more body hair and facial hair.  You are gaining weight or losing weight without changing your exercise and eating habits.  You think you may be pregnant. Get help right away if:  You have abdominal pain that is severe or gets worse.  You cannot eat   or drink without vomiting.  You suddenly develop a fever.  Your menstrual period is much heavier than usual. This information is not intended to replace advice given to you by your health care provider. Make sure you discuss any questions you have with your health care provider. Document Released: 06/29/2005 Document Revised: 01/17/2016 Document Reviewed: 12/01/2015 Elsevier Interactive Patient Education  2017 [employer]  

## 2016-09-24 ENCOUNTER — Encounter: Payer: Self-pay | Admitting: Neurology

## 2016-09-24 ENCOUNTER — Ambulatory Visit (INDEPENDENT_AMBULATORY_CARE_PROVIDER_SITE_OTHER): Payer: BC Managed Care – PPO | Admitting: Neurology

## 2016-09-24 VITALS — BP 130/84 | HR 92 | Ht 64.0 in | Wt 131.3 lb

## 2016-09-24 DIAGNOSIS — G43019 Migraine without aura, intractable, without status migrainosus: Secondary | ICD-10-CM | POA: Diagnosis not present

## 2016-09-24 DIAGNOSIS — G238 Other specified degenerative diseases of basal ganglia: Secondary | ICD-10-CM | POA: Diagnosis not present

## 2016-09-24 DIAGNOSIS — G40909 Epilepsy, unspecified, not intractable, without status epilepticus: Secondary | ICD-10-CM | POA: Diagnosis not present

## 2016-09-24 LAB — CA 125: CA 125: 19 U/mL (ref <35)

## 2016-09-24 MED ORDER — SUMATRIPTAN SUCCINATE 50 MG PO TABS: ORAL_TABLET | ORAL | 3 refills | Status: DC

## 2016-09-24 MED ORDER — NORTRIPTYLINE HCL 10 MG PO CAPS: ORAL_CAPSULE | ORAL | 5 refills | Status: DC

## 2016-09-24 MED ORDER — SUMATRIPTAN SUCCINATE 100 MG PO TABS: ORAL_TABLET | ORAL | 3 refills | Status: DC
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
## 2016-09-24 NOTE — Patient Instructions (Addendum)
1.  Stop magnesium 2.  Start nortriptyline 10mg  at bedtime for 2 week, then increase to 2 tablet at bedtime 3.  For severe headaches, ok to take imitrex 100mg .  Limit to twice per week 4.  Continue Keppra 500mg  twice daily  Return to clinic in May

## 2016-09-30 ENCOUNTER — Encounter: Payer: Self-pay | Admitting: Neurology

## 2016-10-01 ENCOUNTER — Other Ambulatory Visit: Payer: Self-pay | Admitting: *Deleted

## 2016-10-01 DIAGNOSIS — G40909 Epilepsy, unspecified, not intractable, without status epilepticus: Secondary | ICD-10-CM

## 2016-10-02 ENCOUNTER — Ambulatory Visit: Payer: BC Managed Care – PPO | Admitting: Neurology

## 2016-10-05 ENCOUNTER — Ambulatory Visit (HOSPITAL_COMMUNITY)
Admission: RE | Admit: 2016-10-05 | Discharge: 2016-10-05 | Disposition: A | Discharge status: Home / Self Care | Payer: BC Managed Care – PPO | Source: Ambulatory Visit | Attending: Neurology | Admitting: Neurology

## 2016-10-05 ENCOUNTER — Telehealth: Payer: Self-pay | Admitting: *Deleted

## 2016-10-05 ENCOUNTER — Other Ambulatory Visit: Payer: Self-pay | Admitting: *Deleted

## 2016-10-05 ENCOUNTER — Encounter: Payer: Self-pay | Admitting: *Deleted

## 2016-10-05 DIAGNOSIS — G40909 Epilepsy, unspecified, not intractable, without status epilepticus: Secondary | ICD-10-CM

## 2016-10-05 MED ORDER — CLONAZEPAM 0.25 MG PO TBDP
0.2500 mg | ORAL_TABLET | Freq: Two times a day (BID) | ORAL | 0 refills | Status: DC
Start: 2016-10-05 — End: 2016-11-09

## 2016-10-05 NOTE — Telephone Encounter (Signed)
Patient given results and instructions via My Chart.  Will fax Rx.

## 2016-10-05 NOTE — Procedures (Signed)
ELECTROENCEPHALOGRAM REPORT  Date of Study: 10/05/2016  Patient's Name: Candace Stephens MRN: [id number] Date of Birth: 06/20/1986  Referring Provider: Nita Sickleonika Patel, DO  Clinical History: 31 year old female with seizure disorder, presents with intermittent facial twitching.  Medications: albuterol (PROVENTIL HFA;VENTOLIN HFA) 108 (90 Base) MCG/ACT inhaler(Expired)  B Complex Vitamins (VITAMIN B COMPLEX PO)  diclofenac (VOLTAREN) 75 MG EC tablet  fluticasone (FLONASE) 50 MCG/ACT nasal spray  ibuprofen (ADVIL,MOTRIN) 800 MG tablet  IRON PO  levETIRAcetam (KEPPRA) 500 MG tablet  MAGNESIUM PO  nortriptyline (PAMELOR) 10 MG capsule  SUMAtriptan (IMITREX) 100 MG tablet   Technical Summary: A multichannel digital EEG recording measured by the international 10-20 system with electrodes applied with paste and impedances below 5000 ohms performed in our laboratory with EKG monitoring in an awake and drowsy patient.  Hyperventilation and photic stimulation were performed.  The digital EEG was referentially recorded, reformatted, and digitally filtered in a variety of bipolar and referential montages for optimal display.    Description: The patient is awake and drowsy during the recording.  During maximal wakefulness, there is a symmetric, medium voltage 10-11 Hz posterior dominant rhythm that attenuates with eye opening.  The record is symmetric.  During drowsiness, there is an increase in theta slowing of the background.  Vertex waves and symmetric sleep spindles were not seen.  Hyperventilation and photic stimulation did not elicit any abnormalities.  At 26 minutes into study, patient exhibited a facial spasm with revealed muscle artifact but no electrographic correlate.  There were no epileptiform discharges or electrographic seizures seen.    EKG lead was unremarkable.  Impression: This awake and drowsy EEG is normal.  During the study, the patient did exhibit an episode of facial spasm that  did not reveal associated epileptiform activity.  Clinical Correlation: A normal EEG does not exclude a clinical diagnosis of epilepsy.  If further clinical questions remain, prolonged EEG may be helpful.  Clinical correlation is advised.  Shon MilletAdam Jaffe, DO

## 2016-10-05 NOTE — Progress Notes (Signed)
OP EEG completed, results pending. 

## 2016-10-05 NOTE — Telephone Encounter (Signed)
-----   Message from [place] Chardonika K Patel, DO sent at 10/05/2016 12:23 PM EDT ----- Please inform her that her EEG looks good and movements may be hemifacial spasm.  Let's start clonazepam 0.25mg  twice daily, #60.  Please inform her that it may make her sleepy, so start in the evening and see how she does.  It's a very low dose, so she should be okay.

## 2016-10-14 ENCOUNTER — Ambulatory Visit: Payer: BC Managed Care – PPO | Admitting: Neurology

## 2016-10-27 ENCOUNTER — Encounter: Payer: Self-pay | Admitting: Neurology

## 2016-10-28 ENCOUNTER — Other Ambulatory Visit: Payer: Self-pay | Admitting: *Deleted

## 2016-10-28 MED ORDER — CLONAZEPAM 0.5 MG PO TABS: 0.5000 mg | ORAL_TABLET | Freq: Two times a day (BID) | ORAL | 3 refills | Status: DC | PRN

## 2016-11-07 ENCOUNTER — Encounter: Payer: Self-pay | Admitting: Gynecology

## 2016-11-09 ENCOUNTER — Other Ambulatory Visit: Payer: Self-pay | Admitting: Gynecology

## 2016-11-09 ENCOUNTER — Telehealth: Payer: Self-pay | Admitting: *Deleted

## 2016-11-09 ENCOUNTER — Encounter: Payer: Self-pay | Admitting: Gynecology

## 2016-11-09 ENCOUNTER — Ambulatory Visit (INDEPENDENT_AMBULATORY_CARE_PROVIDER_SITE_OTHER): Payer: BC Managed Care – PPO

## 2016-11-09 ENCOUNTER — Ambulatory Visit (INDEPENDENT_AMBULATORY_CARE_PROVIDER_SITE_OTHER): Payer: BC Managed Care – PPO | Admitting: Gynecology

## 2016-11-09 ENCOUNTER — Telehealth: Payer: Self-pay

## 2016-11-09 VITALS — BP 124/82

## 2016-11-09 DIAGNOSIS — N760 Acute vaginitis: Secondary | ICD-10-CM

## 2016-11-09 DIAGNOSIS — R102 Pelvic and perineal pain: Secondary | ICD-10-CM

## 2016-11-09 DIAGNOSIS — N898 Other specified noninflammatory disorders of vagina: Secondary | ICD-10-CM | POA: Diagnosis not present

## 2016-11-09 DIAGNOSIS — R188 Other ascites: Secondary | ICD-10-CM

## 2016-11-09 DIAGNOSIS — N83202 Unspecified ovarian cyst, left side: Secondary | ICD-10-CM

## 2016-11-09 DIAGNOSIS — B9689 Other specified bacterial agents as the cause of diseases classified elsewhere: Secondary | ICD-10-CM

## 2016-11-09 LAB — URINALYSIS W MICROSCOPIC + REFLEX CULTURE
Bilirubin Urine: NEGATIVE
Casts: NONE SEEN [LPF]
Crystals: NONE SEEN [HPF]
GLUCOSE, UA: NEGATIVE
Hgb urine dipstick: NEGATIVE
Ketones, ur: NEGATIVE
LEUKOCYTES UA: NEGATIVE
NITRITE: NEGATIVE
PH: 5.5 (ref 5.0–8.0)
Protein, ur: NEGATIVE
RBC / HPF: NONE SEEN RBC/HPF (ref <=2)
SPECIFIC GRAVITY, URINE: 1.025 (ref 1.001–1.035)
Yeast: NONE SEEN [HPF]

## 2016-11-09 LAB — WET PREP FOR TRICH, YEAST, CLUE
TRICH WET PREP: NONE SEEN
Yeast Wet Prep HPF POC: NONE SEEN

## 2016-11-09 MED ORDER — METRONIDAZOLE 0.75 % VA GEL: 1.0000 | Freq: Two times a day (BID) | VAGINAL | 0 refills | Status: DC

## 2016-11-09 MED ORDER — KETOROLAC TROMETHAMINE 30 MG/ML IJ SOLN
30.0000 mg | Freq: Once | INTRAMUSCULAR | Status: AC
  Administered 2016-11-09: 30 mg via INTRAVENOUS

## 2016-11-09 MED ORDER — KETOROLAC TROMETHAMINE 10 MG PO TABS: 10.0000 mg | ORAL_TABLET | Freq: Four times a day (QID) | ORAL | 0 refills | Status: DC | PRN

## 2016-11-09 NOTE — Patient Instructions (Addendum)
Ovarian Cyst  An ovarian cyst is a fluid-filled sac that forms on an ovary. The ovaries are small organs that produce eggs in women. Various types of cysts can form on the ovaries. Some may cause symptoms and require treatment. Most ovarian cysts go away on their own, are not cancerous (are benign), and do not cause problems. Common types of ovarian cysts include:  Functional (follicle) cysts.  Occur during the menstrual cycle, and usually go away with the next menstrual cycle if you do not get pregnant.  Usually cause no symptoms.  Endometriomas.  Are cysts that form from the tissue that lines the uterus (endometrium).  Are sometimes called "chocolate cysts" because they become filled with blood that turns brown.  Can cause pain in the lower abdomen during intercourse and during your period.  Cystadenoma cysts.  Develop from cells on the outside surface of the ovary.  Can get very large and cause lower abdomen pain and pain with intercourse.  Can cause severe pain if they twist or break open (rupture).  Dermoid cysts.  Are sometimes found in both ovaries.  May contain different kinds of body tissue, such as skin, teeth, hair, or cartilage.  Usually do not cause symptoms unless they get very big.  Theca lutein cysts.  Occur when too much of a certain hormone (human chorionic gonadotropin) is produced and overstimulates the ovaries to produce an egg.  Are most common after having procedures used to assist with the conception of a baby (in vitro fertilization). What are the causes? Ovarian cysts may be caused by:  Ovarian hyperstimulation syndrome. This is a condition that can develop from taking fertility medicines. It causes multiple large ovarian cysts to form.  Polycystic ovarian syndrome (PCOS). This is a common hormonal disorder that can cause ovarian cysts, as well as problems with your period or fertility. What increases the risk? The following factors may make you  more likely to develop ovarian cysts:  Being overweight or obese.  Taking fertility medicines.  Taking certain forms of hormonal birth control.  Smoking. What are the signs or symptoms? Many ovarian cysts do not cause symptoms. If symptoms are present, they may include:  Pelvic pain or pressure.  Pain in the lower abdomen.  Pain during sex.  Abdominal swelling.  Abnormal menstrual periods.  Increasing pain with menstrual periods. How is this diagnosed? These cysts are commonly found during a routine pelvic exam. You may have tests to find out more about the cyst, such as:  Ultrasound.  X-ray of the pelvis.  CT scan.  MRI.  Blood tests. How is this treated? Many ovarian cysts go away on their own without treatment. Your health care provider may want to check your cyst regularly for 2-3 months to see if it changes. If you are in menopause, it is especially important to have your cyst monitored closely because menopausal women have a higher rate of ovarian cancer. When treatment is needed, it may include:  Medicines to help relieve pain.  A procedure to drain the cyst (aspiration).  Surgery to remove the whole cyst.  Hormone treatment or birth control pills. These methods are sometimes used to help dissolve a cyst. Follow these instructions at home:  Take over-the-counter and prescription medicines only as told by your health care provider.  Do not drive or use heavy machinery while taking prescription pain medicine.  Get regular pelvic exams and Pap tests as often as told by your health care provider.  Return to your   normal activities as told by your health care provider. Ask your health care provider what activities are safe for you.  Do not use any products that contain nicotine or tobacco, such as cigarettes and e-cigarettes. If you need help quitting, ask your health care provider.  Keep all follow-up visits as told by your health care provider. This is  important. Contact a health care provider if:  Your periods are late, irregular, or painful, or they stop.  You have pelvic pain that does not go away.  You have pressure on your bladder or trouble emptying your bladder completely.  You have pain during sex.  You have any of the following in your abdomen:  A feeling of fullness.  Pressure.  Discomfort.  Pain that does not go away.  Swelling.  You feel generally ill.  You become constipated.  You lose your appetite.  You develop severe acne.  You start to have more body hair and facial hair.  You are gaining weight or losing weight without changing your exercise and eating habits.  You think you may be pregnant. Get help right away if:  You have abdominal pain that is severe or gets worse.  You cannot eat or drink without vomiting.  You suddenly develop a fever.  Your menstrual period is much heavier than usual. This information is not intended to replace advice given to you by your health care provider. Make sure you discuss any questions you have with your health care provider. Document Released: 06/29/2005 Document Revised: 01/17/2016 Document Reviewed: 12/01/2015 Elsevier Interactive Patient Education  2017 [employer] Ketorolac tablets What is this medicine? KETOROLAC (kee toe ROLE ak) is a non-steroidal anti-inflammatory drug (NSAID). It is used for a short while to treat moderate to severe pain, including pain after surgery. It should not be used for more than 5 days. This medicine may be used for other purposes; ask your health care provider or pharmacist if you have questions. COMMON BRAND NAME(S): Toradol What should I tell my health care provider before I take this medicine? They need to know if you have any of these conditions: -asthma -bleeding problems like hemophilia -cigarette smoker -drink more than 3 alcohol containing drinks a day -heart disease or circulation problems such as heart  failure or leg edema (fluid retention) -high blood pressure -kidney disease -liver disease -stomach bleeding or ulcers -an unusual or allergic reaction to ketorolac, aspirin, other NSAIDs, other medicines, foods, dyes, or preservatives -pregnant or trying to get pregnant -breast-feeding How should I use this medicine? Take this medicine by mouth with a full glass of water. Follow the directions on the prescription label. Take your medicine at regular intervals. Do not take your medicine more often than directed. Do not take more than the recommended dose. A special MedGuide will be given to you by the pharmacist with each prescription and refill. Be sure to read this information carefully each time. Talk to your pediatrician regarding the use of this medicine in children. While this drug may be prescribed for children as young as 76 years of age for selected conditions, precautions do apply. Patients over 77 years old may have a stronger reaction and need a smaller dose. Overdosage: If you think you have taken too much of this medicine contact a poison control center or emergency room at once. NOTE: This medicine is only for you. Do not share this medicine with others. What if I miss a dose? If you miss a dose, take it as soon  as you can. If it is almost time for your next dose, take only that dose. Do not take double or extra doses. What may interact with this medicine? Do not take this medicine with any of the following medications: -aspirin and aspirin-like medicines -cidofovir -methotrexate -NSAIDs, medicines for pain and inflammation, like ibuprofen or naproxen -pemetrexed -probenecid This medicine may also interact with the following medications: -alcohol -alendronate -alprazolam -carbamazepine -cyclosporine -diuretics -flavocoxid -fluoxetine -ginkgo -lithium -medicines for high blood pressure like enalapril -medicines that affect platelets like pentoxifylline -medicines  that treat or prevent blood clots like heparin, warfarin -muscle relaxants -phenytoin -steroid medicines like prednisone or cortisone -thiothixene This list may not describe all possible interactions. Give your health care provider a list of all the medicines, herbs, non-prescription drugs, or dietary supplements you use. Also tell them if you smoke, drink alcohol, or use illegal drugs. Some items may interact with your medicine. What should I watch for while using this medicine? Tell your doctor or health care professional if your pain does not get better. Talk to your doctor before taking another medicine for pain. Do not treat yourself. This medicine does not prevent heart attack or stroke. In fact, this medicine may increase the chance of a heart attack or stroke. The chance may increase with longer use of this medicine and in people who have heart disease. If you take aspirin to prevent heart attack or stroke, talk with your doctor or health care professional. Do not take medicines such as ibuprofen and naproxen with this medicine. Side effects such as stomach upset, nausea, or ulcers may be more likely to occur. Many medicines available without a prescription should not be taken with this medicine. This medicine can cause ulcers and bleeding in the stomach and intestines at any time during treatment. Do not smoke cigarettes or drink alcohol. These increase irritation to your stomach and can make it more susceptible to damage from this medicine. Ulcers and bleeding can happen without warning symptoms and can cause death. You may get drowsy or dizzy. Do not drive, use machinery, or do anything that needs mental alertness until you know how this medicine affects you. Do not stand or sit up quickly, especially if you are an older patient. This reduces the risk of dizzy or fainting spells. This medicine can cause you to bleed more easily. Try to avoid damage to your teeth and gums when you brush or  floss your teeth. What side effects may I notice from receiving this medicine? Side effects that you should report to your doctor or health care professional as soon as possible: -allergic reactions like skin rash, itching or hives, swelling of the face, lips, or tongue -breathing problems -high blood pressure -nausea, vomiting -redness, blistering, peeling or loosening of the skin, including inside the mouth -severe stomach pain -signs and symptoms of bleeding such as bloody or black, tarry stools; red or dark-brown urine; spitting up blood or brown material that looks like coffee grounds; red spots on the skin; unusual bruising or bleeding from the eye, gums, or nose -signs and symptoms of a stroke like changes in vision; confusion; trouble speaking or understanding; severe headaches; sudden numbness or weakness of the face, arm or leg; trouble walking; dizziness; loss of balance or coordination -trouble passing urine or change in the amount of urine -unexplained weight gain or swelling -unusually weak or tired -yellowing of eyes or skin Side effects that usually do not require medical attention (report to your doctor or health  care professional if they continue or are bothersome): -diarrhea -dizziness -headache -heartburn This list may not describe all possible side effects. Call your doctor for medical advice about side effects. You may report side effects to FDA at 1-800-FDA-1088. Where should I keep my medicine? Keep out of the reach of children. Store at room temperature between 20 and 25 degrees C (68 and 77 degrees F). Throw away any unused medicine after the expiration date. NOTE: This sheet is a summary. It may not cover all possible information. If you have questions about this medicine, talk to your doctor, pharmacist, or health care provider.  2018 Elsevier/Gold Standard (2012-11-15 16:36:05) Bacterial Vaginosis Bacterial vaginosis is a vaginal infection that occurs when the  normal balance of bacteria in the vagina is disrupted. It results from an overgrowth of certain bacteria. This is the most common vaginal infection among women ages 36-44. Because bacterial vaginosis increases your risk for STIs (sexually transmitted infections), getting treated can help reduce your risk for chlamydia, gonorrhea, herpes, and HIV (human immunodeficiency virus). Treatment is also important for preventing complications in pregnant women, because this condition can cause an early (premature) delivery. What are the causes? This condition is caused by an increase in harmful bacteria that are normally present in small amounts in the vagina. However, the reason that the condition develops is not fully understood. What increases the risk? The following factors may make you more likely to develop this condition:  Having a new sexual partner or multiple sexual partners.  Having unprotected sex.  Douching.  Having an intrauterine device (IUD).  Smoking.  Drug and alcohol abuse.  Taking certain antibiotic medicines.  Being pregnant. You cannot get bacterial vaginosis from toilet seats, bedding, swimming pools, or contact with objects around you. What are the signs or symptoms? Symptoms of this condition include:  Grey or white vaginal discharge. The discharge can also be watery or foamy.  A fish-like odor with discharge, especially after sexual intercourse or during menstruation.  Itching in and around the vagina.  Burning or pain with urination. Some women with bacterial vaginosis have no signs or symptoms. How is this diagnosed? This condition is diagnosed based on:  Your medical history.  A physical exam of the vagina.  Testing a sample of vaginal fluid under a microscope to look for a large amount of bad bacteria or abnormal cells. Your health care provider may use a cotton swab or a small wooden spatula to collect the sample. How is this treated? This condition is  treated with antibiotics. These may be given as a pill, a vaginal cream, or a medicine that is put into the vagina (suppository). If the condition comes back after treatment, a second round of antibiotics may be needed. Follow these instructions at home: Medicines   Take over-the-counter and prescription medicines only as told by your health care provider.  Take or use your antibiotic as told by your health care provider. Do not stop taking or using the antibiotic even if you start to feel better. General instructions   If you have a female sexual partner, tell her that you have a vaginal infection. She should see her health care provider and be treated if she has symptoms. If you have a female sexual partner, he does not need treatment.  During treatment:  Avoid sexual activity until you finish treatment.  Do not douche.  Avoid alcohol as directed by your health care provider.  Avoid breastfeeding as directed by your health care provider.  Drink enough water and fluids to keep your urine clear or pale yellow.  Keep the area around your vagina and rectum clean.  Wash the area daily with warm water.  Wipe yourself from front to back after using the toilet.  Keep all follow-up visits as told by your health care provider. This is important. How is this prevented?  Do not douche.  Wash the outside of your vagina with warm water only.  Use protection when having sex. This includes latex condoms and dental dams.  Limit how many sexual partners you have. To help prevent bacterial vaginosis, it is best to have sex with just one partner (monogamous).  Make sure you and your sexual partner are tested for STIs.  Wear cotton or cotton-lined underwear.  Avoid wearing tight pants and pantyhose, especially during summer.  Limit the amount of alcohol that you drink.  Do not use any products that contain nicotine or tobacco, such as cigarettes and e-cigarettes. If you need help quitting,  ask your health care provider.  Do not use illegal drugs. Where to find more information:  Centers for Disease Control and Prevention: SolutionApps.co.za  American Sexual Health Association (ASHA): www.ashastd.org  [place] Department of Health and Health and safety inspector, Office on Women's Health: ConventionalMedicines.si or http://www.anderson-williamson.info/ Contact a health care provider if:  Your symptoms do not improve, even after treatment.  You have more discharge or pain when urinating.  You have a fever.  You have pain in your abdomen.  You have pain during sex.  You have vaginal bleeding between periods. Summary  Bacterial vaginosis is a vaginal infection that occurs when the normal balance of bacteria in the vagina is disrupted.  Because bacterial vaginosis increases your risk for STIs (sexually transmitted infections), getting treated can help reduce your risk for chlamydia, gonorrhea, herpes, and HIV (human immunodeficiency virus). Treatment is also important for preventing complications in pregnant women, because the condition can cause an early (premature) delivery.  This condition is treated with antibiotic medicines. These may be given as a pill, a vaginal cream, or a medicine that is put into the vagina (suppository). This information is not intended to replace advice given to you by your health care provider. Make sure you discuss any questions you have with your health care provider. Document Released: 06/29/2005 Document Revised: 03/14/2016 Document Reviewed: 03/14/2016 Elsevier Interactive Patient Education  2017 [employer].

## 2016-11-09 NOTE — Telephone Encounter (Signed)
Lupita Leash informed. She will call patient and arrange appointment.

## 2016-11-09 NOTE — Telephone Encounter (Signed)
Yes and check with panel for ultrasound today

## 2016-11-09 NOTE — Progress Notes (Addendum)
     Patient is a 31 year old that presented to the office today complaining of left lower abdominal discomfort has been present for several days and this morning she experienced a sharp stabbing sensation in that area. Patient denied any dysuria or frequency or any back pain, fever, chills, nausea or vomiting no GI complaints.  Review of her record indicated that on February 13 she had a Mirena IUD placed. She had an ultrasound in the office on 09/23/2016 in the following was noted:  Uterus measured 8.9 x 5.4 x 3.4 cm with an millimeters stripe of 3 mm. IUD was seen in the normal position. Free fluid in the cul-de-sac was noted 26 x 17 mm. 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 Left ovary was normal.  That same office visit she had a CA 125 with a value of 19.  Exam: Back: No CVA tenderness Abdomen: Soft slightly tender in the left lower quadrant but no rebound or guarding Pelvic: Clear fascia odor discharge Cervix: IUD string visualized Uterus: Anteverted normal size shape and consistency right adnexa no masses or tenderness segments a mildly tender no rebound or guarding Rectal exam not done  Ultrasound today, IUD in the normal position, uterus 9.9 x 5.9 x 4.7 cm with endometrial stripe 5 mm. Right ovary was normal. Left ovary thinwall cyst 36 x 20 x 24 mm reticular echo pattern with positive color flow the periphery of the ovarian cyst. Left ovary surrounded by free fluid measuring 59 x 17 mm. Fluid in the cul-de-sac 15 x 26 mm  Wet prep: Moderate clue cells, few white blood cell, too numerous to count bacteria  Urinalysis: Moderate bacteria, 0-WBC, no white blood cells seen urine culture pending.  Assessment/plan: When compared with previous ultrasound on March 2018 it appears patient had a ruptured right ovarian cyst the right ovary is currently normal on ultrasound. She  does have a small thin ovarian cyst 26 x 20 x 24 mm. Patient received Toradol 30 mg IM today and a prescription for Toradol 10 mg every 6 hours when necessary for next 3-5 days. For her bacterial vaginosis and she was prescribed MetroGel vaginal cream to apply daily at bedtime for 5-7 days. She'll return back to the office the end of July to make sure the cyst is completely resolved. Of note the IUD was in the proper position on the ultrasound today.  The pharmacist at [facility] was contacted there was no contraindication for patient to receive the Toradol when her medication list was reviewed

## 2016-11-09 NOTE — Addendum Note (Signed)
Addended by: Dayna Barker on: 11/09/2016 04:15 PM   Modules accepted: Orders

## 2016-11-09 NOTE — Telephone Encounter (Signed)
Pt aware, Rx sent. 

## 2016-11-09 NOTE — Telephone Encounter (Signed)
Yes please call in 

## 2016-11-09 NOTE — Addendum Note (Signed)
Addended by: Ok Edwards on: 11/09/2016 02:10 PM   Modules accepted: Orders

## 2016-11-09 NOTE — Addendum Note (Signed)
Addended by: Lear Ng on: 11/09/2016 02:39 PM   Modules accepted: Orders

## 2016-11-09 NOTE — Telephone Encounter (Signed)
Patient sent email today:   "Hi Dr. Lily Peer, I have been experiencing very bad cramping like pains. I feel as if I have another cyst or the other one has become larger. I have weakness in my left leg putting pressure on it and in my pelvic/hip area. When I'm laying down I can relieve some of the pressure by laying on my right side but throughout the night I wake up on my left side in very bad pain. My stomach is very bloated and my cycle was 2 weeks ago. I'm unsure what to do because a bath and heating pad aren't helping. "  I recommended office visit. Ok to work her in?

## 2016-11-09 NOTE — Telephone Encounter (Signed)
Pt was seen today and Rx for Toradol 10 mg was never received at pharmacy, #30 tablets? 0 refills? Please advise

## 2016-11-10 LAB — URINE CULTURE

## 2016-11-12 ENCOUNTER — Encounter: Payer: Self-pay | Admitting: Neurology

## 2016-11-17 ENCOUNTER — Encounter: Payer: Self-pay | Admitting: Gynecology

## 2016-11-23 ENCOUNTER — Encounter: Payer: Self-pay | Admitting: Neurology

## 2016-11-23 ENCOUNTER — Ambulatory Visit (INDEPENDENT_AMBULATORY_CARE_PROVIDER_SITE_OTHER): Payer: BC Managed Care – PPO | Admitting: Neurology

## 2016-11-23 VITALS — BP 110/70 | HR 106 | Ht 64.0 in | Wt 136.4 lb

## 2016-11-23 DIAGNOSIS — G40909 Epilepsy, unspecified, not intractable, without status epilepticus: Secondary | ICD-10-CM | POA: Diagnosis not present

## 2016-11-23 DIAGNOSIS — G238 Other specified degenerative diseases of basal ganglia: Secondary | ICD-10-CM

## 2016-11-23 DIAGNOSIS — G43009 Migraine without aura, not intractable, without status migrainosus: Secondary | ICD-10-CM | POA: Diagnosis not present

## 2016-11-23 DIAGNOSIS — G5139 Clonic hemifacial spasm, unspecified: Secondary | ICD-10-CM

## 2016-11-23 DIAGNOSIS — G513 Clonic hemifacial spasm: Secondary | ICD-10-CM | POA: Diagnosis not present

## 2016-11-23 NOTE — Patient Instructions (Addendum)
Reduce clonazepam to 0.25mg  twice daily.  Okay to take extra dose as needed during the day Continue nortriptyline 20mg  at bedtime and Keppra 1000mg  daily  Return to clinic in June
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
## 2016-11-25 ENCOUNTER — Encounter: Payer: Self-pay | Admitting: Gynecology

## 2016-12-14 ENCOUNTER — Other Ambulatory Visit: Payer: BC Managed Care – PPO

## 2016-12-14 ENCOUNTER — Ambulatory Visit: Payer: BC Managed Care – PPO | Admitting: Gynecology

## 2017-01-01 ENCOUNTER — Encounter: Payer: Self-pay | Admitting: Neurology

## 2017-01-01 ENCOUNTER — Ambulatory Visit (INDEPENDENT_AMBULATORY_CARE_PROVIDER_SITE_OTHER): Payer: BC Managed Care – PPO | Admitting: Neurology

## 2017-01-01 VITALS — BP 110/70 | HR 115 | Ht 64.0 in | Wt 135.6 lb

## 2017-01-01 DIAGNOSIS — G513 Clonic hemifacial spasm: Secondary | ICD-10-CM

## 2017-01-01 DIAGNOSIS — G5139 Clonic hemifacial spasm, unspecified: Secondary | ICD-10-CM

## 2017-01-01 DIAGNOSIS — G238 Other specified degenerative diseases of basal ganglia: Secondary | ICD-10-CM | POA: Diagnosis not present

## 2017-01-01 DIAGNOSIS — G43009 Migraine without aura, not intractable, without status migrainosus: Secondary | ICD-10-CM

## 2017-01-01 DIAGNOSIS — G40909 Epilepsy, unspecified, not intractable, without status epilepticus: Secondary | ICD-10-CM
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
## 2017-01-01 NOTE — Patient Instructions (Signed)
You are cleared to resume driving Continue Keppra 1000mg  daily Continue nortriptyline 20mg  at bedtime You can take clonazepam 0.125mg  twice daily as needed for facial spasm  Return to clinic in 6 months

## 2017-01-18 ENCOUNTER — Ambulatory Visit: Payer: BC Managed Care – PPO | Admitting: Neurology

## 2017-01-19 ENCOUNTER — Encounter: Payer: Self-pay | Admitting: Neurology

## 2017-01-26 DIAGNOSIS — Z029 Encounter for administrative examinations, unspecified: Secondary | ICD-10-CM

## 2017-02-03 ENCOUNTER — Ambulatory Visit (INDEPENDENT_AMBULATORY_CARE_PROVIDER_SITE_OTHER): Payer: BC Managed Care – PPO

## 2017-02-03 ENCOUNTER — Ambulatory Visit (INDEPENDENT_AMBULATORY_CARE_PROVIDER_SITE_OTHER): Payer: BC Managed Care – PPO | Admitting: Gynecology

## 2017-02-03 ENCOUNTER — Other Ambulatory Visit: Payer: BC Managed Care – PPO

## 2017-02-03 ENCOUNTER — Other Ambulatory Visit: Payer: Self-pay | Admitting: Gynecology

## 2017-02-03 ENCOUNTER — Ambulatory Visit: Payer: BC Managed Care – PPO | Admitting: Gynecology

## 2017-02-03 ENCOUNTER — Encounter: Payer: Self-pay | Admitting: Gynecology

## 2017-02-03 VITALS — BP 112/72

## 2017-02-03 DIAGNOSIS — N83202 Unspecified ovarian cyst, left side: Secondary | ICD-10-CM

## 2017-02-03 DIAGNOSIS — Z8742 Personal history of other diseases of the female genital tract: Secondary | ICD-10-CM

## 2017-02-03 DIAGNOSIS — N83201 Unspecified ovarian cyst, right side: Secondary | ICD-10-CM

## 2017-02-03 DIAGNOSIS — R110 Nausea: Secondary | ICD-10-CM

## 2017-02-03 LAB — PREGNANCY, URINE: PREG TEST UR: NEGATIVE

## 2017-02-03 NOTE — Progress Notes (Signed)
   Patient is a 31 year old that presented to the office today for 3 month follow-up on right ovarian cyst. Patient had an ultrasound done back pain as a result of left lower abdominal discomfort.Review of her record indicated that on February 13 she had a Mirena IUD placed. She had an ultrasound in the office on 09/23/2016 in the following was noted:  Uterus measured 8.9 x 5.4 x 3.4 cm with an millimeters stripe of 3 mm. IUD was seen in the normal position. Free fluid in the cul-de-sac was noted 26 x 17 mm. 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 Left ovary was normal.  That same office visit she had a CA 125 with a value of 19.  Patient had a follow-up ultrasound on 11/09/2016 which demonstrated the following:  IUD in the normal position, uterus 9.9 x 5.9 x 4.7 cm with endometrial stripe 5 mm. Right ovary was normal. Left ovary thinwall cyst 36 x 20 x 24 mm reticular echo pattern with positive color flow the periphery of the ovarian cyst. Left ovary surrounded by free fluid measuring 59 x 17 mm. Fluid in the cul-de-sac 15 x 26 mm  She is doing well today she's had 2 episodes several weeks apart of some nausea but no vomiting. Ultrasound today demonstrated the following: Uterus 9.0 x 5.2 x 3.8 cm with endometrial stripe of 4.2 mm. IUD was seen in the normal position. Right ovary was normal. Previous cyst not seen for 10/09/2016. Left ovary thick wall involuting corpus luteum cyst measuring 19 x 16 mm with positive color flow in the periphery previous cyst of April 2018 not seen. There was some free fluid in the cul-de-sac 18 x 16 mm and 23 x 16 mm slightly above the uterus.  Urine presented today was negative.  Assessment/plan: Patient with past history of ovarian cyst completely resolution doing well otherwise patient to monitor her nausea if it because of repetitive issue she may  want to consider removing the Mirena IUD because it is one of the reported side effects. She will watch for now.

## 2017-03-12 ENCOUNTER — Encounter: Payer: Self-pay | Admitting: Neurology

## 2017-03-24 ENCOUNTER — Other Ambulatory Visit: Payer: Self-pay | Admitting: Neurology

## 2017-03-25 ENCOUNTER — Other Ambulatory Visit: Payer: Self-pay | Admitting: *Deleted

## 2017-03-25 MED ORDER — NORTRIPTYLINE HCL 10 MG PO CAPS: ORAL_CAPSULE | ORAL | 5 refills | Status: DC

## 2017-04-06 ENCOUNTER — Encounter: Payer: Self-pay | Admitting: Neurology

## 2017-05-03 ENCOUNTER — Encounter: Payer: Self-pay | Admitting: Obstetrics & Gynecology

## 2017-05-04 ENCOUNTER — Encounter: Payer: Self-pay | Admitting: Women's Health

## 2017-05-04 ENCOUNTER — Ambulatory Visit (INDEPENDENT_AMBULATORY_CARE_PROVIDER_SITE_OTHER): Payer: BC Managed Care – PPO | Admitting: Women's Health

## 2017-05-04 VITALS — BP 132/84

## 2017-05-04 DIAGNOSIS — B9689 Other specified bacterial agents as the cause of diseases classified elsewhere: Secondary | ICD-10-CM | POA: Diagnosis not present

## 2017-05-04 DIAGNOSIS — N946 Dysmenorrhea, unspecified: Secondary | ICD-10-CM | POA: Diagnosis not present

## 2017-05-04 DIAGNOSIS — N920 Excessive and frequent menstruation with regular cycle: Secondary | ICD-10-CM | POA: Diagnosis not present

## 2017-05-04 DIAGNOSIS — N3001 Acute cystitis with hematuria: Secondary | ICD-10-CM

## 2017-05-04 DIAGNOSIS — N760 Acute vaginitis: Secondary | ICD-10-CM | POA: Diagnosis not present

## 2017-05-04 LAB — WET PREP FOR TRICH, YEAST, CLUE

## 2017-05-04 MED ORDER — METRONIDAZOLE 500 MG PO TABS: 500.0000 mg | ORAL_TABLET | Freq: Two times a day (BID) | ORAL | 0 refills | Status: DC

## 2017-05-04 MED ORDER — MEGESTROL ACETATE 40 MG PO TABS: 40.0000 mg | ORAL_TABLET | Freq: Two times a day (BID) | ORAL | 0 refills | Status: DC

## 2017-05-04 MED ORDER — IBUPROFEN 600 MG PO TABS: 600.0000 mg | ORAL_TABLET | Freq: Three times a day (TID) | ORAL | 1 refills | Status: DC | PRN

## 2017-05-04 MED ORDER — SULFAMETHOXAZOLE-TRIMETHOPRIM 800-160 MG PO TABS: 1.0000 | ORAL_TABLET | Freq: Two times a day (BID) | ORAL | 0 refills | Status: DC

## 2017-05-04 NOTE — Progress Notes (Signed)
31 year old SBF G1 P1 Presents with several complaints. For the past week has had increased urinary frequency with bladder pressure, mild back pain, irregular bleeding, cramping and discharge without itching or odor. Denies dysuria, nausea, constipation or fever. Same partner with negative STD screen. 08/25/2016 Mirena IUD continues to have monthly cycles lasting 8-12 days using 4-5 pads most days. 01/2017 ultrasound confirmed proper placement of IUD. Use of OCs in the past had breakthrough bleeding with cycles lasting 1-3 weeks and generally did not feel well. History of ovarian cysts in the past. 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  Exam: Appears uncomfortable, no CVAT, abdomen tenderness in suprapubic area, soft without rebound or radiation of pain, external genitalia within normal limits, speculum exam IUD strings visible, no visible blood, white adherent discharge with odor noted, wet prep positive for clues, TNTC bacteria. Mild tenderness with exam, no adnexal fullness. UA: +1 blood, +2 leukocytes, 40-60 WBCs, 10-20 rbc's, 10- 20 squamous epithelials, many bacteria  UTI Bacteria vaginosis Menorrhagia with Mirena IUD  Plan: Septra twice daily for 3 days, UTI prevention discussed. Flagyl 500 twice daily for 7 days, alcohol precautions reviewed. Megace 40 mg twice daily for 10 days with next cycle after day 5, instructed to call if no relief of symptoms. Motrin 600 every 8 hours as needed for discomfort.

## 2017-05-04 NOTE — Patient Instructions (Signed)
Urinary Tract Infection, Adult °A urinary tract infection (UTI) is an infection of any part of the urinary tract, which includes the kidneys, ureters, bladder, and urethra. These organs make, store, and get rid of urine in the body. UTI can be a bladder infection (cystitis) or kidney infection (pyelonephritis). °What are the causes? °This infection may be caused by fungi, viruses, or bacteria. Bacteria are the most common cause of UTIs. This condition can also be caused by repeated incomplete emptying of the bladder during urination. °What increases the risk? °This condition is more likely to develop if: °· You ignore your need to urinate or hold urine for long periods of time. °· You do not empty your bladder completely during urination. °· You wipe back to front after urinating or having a bowel movement, if you are female. °· You are uncircumcised, if you are female. °· You are constipated. °· You have a urinary catheter that stays in place (indwelling). °· You have a weak defense (immune) system. °· You have a medical condition that affects your bowels, kidneys, or bladder. °· You have diabetes. °· You take antibiotic medicines frequently or for long periods of time, and the antibiotics no longer work well against certain types of infections (antibiotic resistance). °· You take medicines that irritate your urinary tract. °· You are exposed to chemicals that irritate your urinary tract. °· You are female. ° °What are the signs or symptoms? °Symptoms of this condition include: °· Fever. °· Frequent urination or passing small amounts of urine frequently. °· Needing to urinate urgently. °· Pain or burning with urination. °· Urine that smells bad or unusual. °· Cloudy urine. °· Pain in the lower abdomen or back. °· Trouble urinating. °· Blood in the urine. °· Vomiting or being less hungry than normal. °· Diarrhea or abdominal pain. °· Vaginal discharge, if you are female. ° °How is this diagnosed? °This condition is  diagnosed with a medical history and physical exam. You will also need to provide a urine sample to test your urine. Other tests may be done, including: °· Blood tests. °· Sexually transmitted disease (STD) testing. ° °If you have had more than one UTI, a cystoscopy or imaging studies may be done to determine the cause of the infections. °How is this treated? °Treatment for this condition often includes a combination of two or more of the following: °· Antibiotic medicine. °· Other medicines to treat less common causes of UTI. °· Over-the-counter medicines to treat pain. °· Drinking enough water to stay hydrated. ° °Follow these instructions at home: °· Take over-the-counter and prescription medicines only as told by your health care provider. °· If you were prescribed an antibiotic, take it as told by your health care provider. Do not stop taking the antibiotic even if you start to feel better. °· Avoid alcohol, caffeine, tea, and carbonated beverages. They can irritate your bladder. °· Drink enough fluid to keep your urine clear or pale yellow. °· Keep all follow-up visits as told by your health care provider. This is important. °· Make sure to: °? Empty your bladder often and completely. Do not hold urine for long periods of time. °? Empty your bladder before and after sex. °? Wipe from front to back after a bowel movement if you are female. Use each tissue one time when you wipe. °Contact a health care provider if: °· You have back pain. °· You have a fever. °· You feel nauseous or vomit. °· Your symptoms do not   get better after 3 days. °· Your symptoms go away and then return. °Get help right away if: °· You have severe back pain or lower abdominal pain. °· You are vomiting and cannot keep down any medicines or water. °This information is not intended to replace advice given to you by your health care provider. Make sure you discuss any questions you have with your health care provider. °Document Released:  04/08/2005 Document Revised: 12/11/2015 Document Reviewed: 05/20/2015 °Elsevier Interactive Patient Education © 2017 Elsevier Inc. ° ° °Bacterial Vaginosis °Bacterial vaginosis is an infection of the vagina. It happens when too many germs (bacteria) grow in the vagina. This infection puts you at risk for infections from sex (STIs). Treating this infection can lower your risk for some STIs. You should also treat this if you are pregnant. It can cause your baby to be born early. °Follow these instructions at home: °Medicines °· Take over-the-counter and prescription medicines only as told by your doctor. °· Take or use your antibiotic medicine as told by your doctor. Do not stop taking or using it even if you start to feel better. °General instructions °· If you your sexual partner is a woman, tell her that you have this infection. She needs to get treatment if she has symptoms. If you have a female partner, he does not need to be treated. °· During treatment: °? Avoid sex. °? Do not douche. °? Avoid alcohol as told. °? Avoid breastfeeding as told. °· Drink enough fluid to keep your pee (urine) clear or pale yellow. °· Keep your vagina and butt (rectum) clean. °? Wash the area with warm water every day. °? Wipe from front to back after you use the toilet. °· Keep all follow-up visits as told by your doctor. This is important. °Preventing this condition °· Do not douche. °· Use only warm water to wash around your vagina. °· Use protection when you have sex. This includes: °? Latex condoms. °? Dental dams. °· Limit how many people you have sex with. It is best to only have sex with the same person (be monogamous). °· Get tested for STIs. Have your partner get tested. °· Wear underwear that is cotton or lined with cotton. °· Avoid tight pants and pantyhose. This is most important in summer. °· Do not use any products that have nicotine or tobacco in them. These include cigarettes and e-cigarettes. If you need help  quitting, ask your doctor. °· Do not use illegal drugs. °· Limit how much alcohol you drink. °Contact a doctor if: °· Your symptoms do not get better, even after you are treated. °· You have more discharge or pain when you pee (urinate). °· You have a fever. °· You have pain in your belly (abdomen). °· You have pain with sex. °· Your bleed from your vagina between periods. °Summary °· This infection happens when too many germs (bacteria) grow in the vagina. °· Treating this condition can lower your risk for some infections from sex (STIs). °· You should also treat this if you are pregnant. It can cause early (premature) birth. °· Do not stop taking or using your antibiotic medicine even if you start to feel better. °This information is not intended to replace advice given to you by your health care provider. Make sure you discuss any questions you have with your health care provider. °Document Released: 04/07/2008 Document Revised: 03/14/2016 Document Reviewed: 03/14/2016 °Elsevier Interactive Patient Education © 2017 Elsevier Inc. ° °

## 2017-05-06 LAB — URINALYSIS W MICROSCOPIC + REFLEX CULTURE
BILIRUBIN URINE: NEGATIVE
GLUCOSE, UA: NEGATIVE
Hyaline Cast: NONE SEEN /LPF
Nitrites, Initial: NEGATIVE
SPECIFIC GRAVITY, URINE: 1.025 (ref 1.001–1.03)
pH: 6 (ref 5.0–8.0)

## 2017-05-06 LAB — URINE CULTURE
MICRO NUMBER:: 81190353
SPECIMEN QUALITY: ADEQUATE

## 2017-05-06 LAB — CULTURE INDICATED

## 2017-05-14 ENCOUNTER — Encounter: Payer: Self-pay | Admitting: Women's Health

## 2017-05-27 ENCOUNTER — Telehealth: Payer: Self-pay | Admitting: *Deleted

## 2017-05-27 NOTE — Telephone Encounter (Signed)
Message left

## 2017-05-27 NOTE — Telephone Encounter (Signed)
TC pt states even with megace 40 bid for 10 days bleeding did not stop.  Desires no more children, has a 351 year old. Requesting hysterectomy.  Heavy long cycles. Normal [facility] 06/2017, other than blood clot in uterus noted, [facility], now on med for seizures.  Mirena iud 08/2012 with continued bleeding and spotting. US confirmed proper placement. Briefly reviewed ablation.  Instructed to schedule appt with Dr Seymour BarsLavoie to discuss options. (Prev Dr Lily PeerFernandez pt)

## 2017-05-27 NOTE — Telephone Encounter (Signed)
(  pt aware you are out of the office) Pt called to follow up regarding bleeding, states only spotting now, wearing liner, completed megace 40 mg x 10 days. Called because bleeding never stopped since 05/04/17. Pt would like recommendations. Please advise

## 2017-05-28 NOTE — Telephone Encounter (Signed)
Ok, thanks.

## 2017-05-28 NOTE — Telephone Encounter (Signed)
mirena IUD 08/2016 not [id number]

## 2017-05-28 NOTE — Telephone Encounter (Signed)
Thank you Harriett SineNancy, agree.

## 2017-06-08 ENCOUNTER — Encounter: Payer: Self-pay | Admitting: Obstetrics & Gynecology

## 2017-06-08 ENCOUNTER — Ambulatory Visit: Payer: BC Managed Care – PPO | Admitting: Obstetrics & Gynecology

## 2017-06-08 VITALS — BP 126/72

## 2017-06-08 DIAGNOSIS — N946 Dysmenorrhea, unspecified: Secondary | ICD-10-CM | POA: Diagnosis not present

## 2017-06-08 DIAGNOSIS — N921 Excessive and frequent menstruation with irregular cycle: Secondary | ICD-10-CM

## 2017-06-08 NOTE — Progress Notes (Signed)
    Candace Stephens 16-Apr-1986 [id number]        31 y.o.  G1P1 Long term boyfriend.  Son is 379 yo doing well.  No desire to preserve Fertility.  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  Tried DepoProvera, BCPs without improvement in the past.  Currently experiencing irregular and heavy bleeding.  Until now had monthly heavy menses lastin 8-12 days. The bleeding is accompanied by severe uterine cramps.  No improvement on Megace.  No abnormal vaginal discharge currently.  Recent BV treatment end of 04/2017.  Patient is sexually active, STI screen neg 08/2016.  No urinary tract infection symptoms.  Normal bowel movements.  No fever.  SonoHystero 06/2016 Negative except for blood clot in IU cavity.  Had a Seizure at time of procedure.  On Anticonvulsive therapy now.  Past medical history,surgical history, problem list, medications, allergies, family history and social history were all reviewed and documented in the EPIC chart.  Directed ROS with pertinent positives and negatives documented in the history of present illness/assessment and plan.  Exam:  Vitals:   06/08/17 1429  BP: 126/72   General appearance:  Normal  Abdomen normal  Gyn exam:  Vulva normal.  Speculum:  Cervix normal.  IUD strings visible.  Vagina normal.  Normal secretions.    Pelvic US 02/03/2017: Uterus 9.0 x 5.2 x 3.8 cm with endometrial stripe of 4.2 mm. IUD was seen in the normal position. Right ovary was normal. Previous cyst not seen for 10/09/2016. Left ovary thick wall involuting corpus luteum cyst measuring 19 x 16 mm with positive color flow in the periphery previous cyst of April 2018 not seen. There was some free fluid in the cul-de-sac 18 x 16 mm and 23 x 16 mm slightly above the uterus.   Assessment/Plan:  31 y.o. G1P1   1. Menometrorrhagia Refractory menometrorrhagia and severe dysmenorrhea.  Possible adenomyosis.  No desire to conceive in the future.  Endometrial ablation  discussed with patient but prefers definitive surgical treatment with hysterectomy.  Previous delivery by C-section.  Decision to proceed with laparoscopy assisted vaginal hysterectomy with bilateral salpingectomy.  Surgery and risks reviewed.  Will follow up for preop visit. - TSH - CBC  2. Severe dysmenorrhea As above.  Counseling on above issues more than 50% for 25 minutes.  Genia DelMarie-Lyne Stevens Magwood MD, 2:59 PM 06/08/2017

## 2017-06-09 ENCOUNTER — Encounter: Payer: Self-pay | Admitting: Obstetrics & Gynecology

## 2017-06-09 LAB — CBC
HCT: 35.3 % (ref 35.0–45.0)
HEMOGLOBIN: 11.6 g/dL — AB (ref 11.7–15.5)
MCH: 27 pg (ref 27.0–33.0)
MCHC: 32.9 g/dL (ref 32.0–36.0)
MCV: 82.1 fL (ref 80.0–100.0)
MPV: 10.7 fL (ref 7.5–12.5)
PLATELETS: 178 10*3/uL (ref 140–400)
RBC: 4.3 10*6/uL (ref 3.80–5.10)
RDW: 12.4 % (ref 11.0–15.0)
WBC: 3.7 10*3/uL — ABNORMAL LOW (ref 3.8–10.8)

## 2017-06-09 LAB — TSH: TSH: 2.63 mIU/L

## 2017-06-10 ENCOUNTER — Other Ambulatory Visit: Payer: Self-pay | Admitting: Women's Health

## 2017-06-10 ENCOUNTER — Other Ambulatory Visit: Payer: Self-pay | Admitting: Obstetrics & Gynecology

## 2017-06-10 DIAGNOSIS — N946 Dysmenorrhea, unspecified: Secondary | ICD-10-CM

## 2017-06-10 DIAGNOSIS — R710 Precipitous drop in hematocrit: Secondary | ICD-10-CM

## 2017-06-10 DIAGNOSIS — Z0289 Encounter for other administrative examinations: Secondary | ICD-10-CM

## 2017-06-10 DIAGNOSIS — D708 Other neutropenia: Secondary | ICD-10-CM

## 2017-06-11 ENCOUNTER — Ambulatory Visit: Payer: BC Managed Care – PPO | Admitting: Obstetrics & Gynecology

## 2017-06-12 ENCOUNTER — Encounter: Payer: Self-pay | Admitting: Obstetrics & Gynecology

## 2017-06-12 NOTE — Patient Instructions (Signed)
1. Menometrorrhagia Refractory menometrorrhagia and severe dysmenorrhea.  Possible adenomyosis.  No desire to conceive in the future.  Endometrial ablation discussed with patient but prefers definitive surgical treatment with hysterectomy.  Previous delivery by C-section.  Decision to proceed with laparoscopy assisted vaginal hysterectomy with bilateral salpingectomy.  Surgery and risks reviewed.  Will follow up for preop visit. - TSH - CBC  2. Severe dysmenorrhea As above.  Candace Stephens, it was a pleasure meeting you today!  I will see you again soon for the preop.  Laparoscopically Assisted Vaginal Hysterectomy A laparoscopically assisted vaginal hysterectomy (LAVH) is a surgical procedure to remove the uterus and cervix, and sometimes the ovaries and fallopian tubes. During an LAVH, some of the surgical removal is done through the vagina, and the rest is done through a few small surgical cuts (incisions) in the abdomen. This procedure is usually considered in women when a vaginal hysterectomy is not an option. Your health care provider will discuss the risks and benefits of the different surgical techniques at your appointment. Generally, recovery time is faster and there are fewer complications after laparoscopic procedures than after open incisional procedures. Tell a health care provider about:  Any allergies you have.  All medicines you are taking, including vitamins, herbs, eye drops, creams, and over-the-counter medicines.  Any problems you or family members have had with anesthetic medicines.  Any blood disorders you have.  Any surgeries you have had.  Any medical conditions you have. What are the risks? Generally, this is a safe procedure. However, as with any procedure, complications can occur. Possible complications include:  Allergies to medicines.  Difficulty breathing.  Bleeding.  Infection.  Damage to other structures near your uterus and cervix.  What happens before  the procedure?  Ask your health care provider about changing or stopping your regular medicines.  Take certain medicines, such as a colon-emptying preparation, as directed.  Do not eat or drink anything for at least 8 hours before your surgery.  Stop smoking if you smoke. Stopping will improve your health after surgery.  Arrange for a ride home after surgery and for help at home during recovery. What happens during the procedure?  An IV tube will be put into one of your veins in order to give you fluids and medicines.  You will receive medicines to relax you and medicines that make you sleep (general anesthetic).  You may have a flexible tube (catheter) put into your bladder to drain urine.  You may have a tube put through your nose or mouth that goes into your stomach (nasogastric tube). The nasogastric tube removes digestive fluids and prevents you from feeling nauseated and from vomiting.  Tight-fitting (compression) stockings will be placed on your legs to promote circulation.  Three to four small incisions will be made in your abdomen. An incision also will be made in your vagina. Probes and tools will be inserted into the small incisions. The uterus and cervix are removed (and possibly your ovaries and fallopian tubes) through your vagina as well as through the small incisions that were made in the abdomen.  Your vagina is then sewn back to normal. What happens after the procedure?  You may have a liquid diet temporarily. You will most likely return to, and tolerate, your usual diet the day after surgery.  You will be passing urine through a catheter. It will be removed the day after surgery.  Your temperature, breathing rate, heart rate, blood pressure, and oxygen level will be monitored  regularly.  You will still wear compression stockings on your legs until you are able to move around.  You will use a special device or do breathing exercises to keep your lungs  clear.  You will be encouraged to walk as soon as possible. This information is not intended to replace advice given to you by your health care provider. Make sure you discuss any questions you have with your health care provider. Document Released: 06/18/2011 Document Revised: 12/05/2015 Document Reviewed: 01/12/2013 Elsevier Interactive Patient Education  [employer]2018 Else[employer] Inc.

## 2017-06-14 ENCOUNTER — Encounter: Payer: Self-pay | Admitting: Obstetrics & Gynecology

## 2017-06-14 ENCOUNTER — Ambulatory Visit: Payer: BC Managed Care – PPO | Admitting: Obstetrics & Gynecology

## 2017-06-14 VITALS — BP 130/80

## 2017-06-14 DIAGNOSIS — N946 Dysmenorrhea, unspecified: Secondary | ICD-10-CM

## 2017-06-14 DIAGNOSIS — N921 Excessive and frequent menstruation with irregular cycle: Secondary | ICD-10-CM

## 2017-06-14 NOTE — Patient Instructions (Signed)
1. Menometrorrhagia Refractory menometrorrhagia and severe dysmenorrhea.  Possible adenomyosis.  No desire to conceive in the future.  Endometrial ablation discussed with patient but prefers definitive surgical treatment with hysterectomy.  Previous delivery by C-section.  Decision to proceed with laparoscopy assisted vaginal hysterectomy with bilateral salpingectomy.  Remove IUD at time of surgery.  Surgery and risks reviewed thoroughly.   2. Severe dysmenorrhea As above.                        Patient was counseled as to the risk of surgery to include the following:  1. Infection (prohylactic antibiotics will be administered)  2. DVT/Pulmonary Embolism (prophylactic pneumo compression stockings will be used)  3.Trauma to internal organs requiring additional surgical procedure to repair any injury to internal organs requiring perhaps additional hospitalization days.  4.Hemmorhage requiring transfusion and blood products which carry risks such as anaphylactic reaction, hepatitis and AIDS  Patient had received literature information on the procedure scheduled and all her questions were answered and fully accepts all risk.  Candace Stephens, good seeing you today!

## 2017-06-14 NOTE — Progress Notes (Signed)
    Candace Stephens 1985/07/21 [id number]        31 y.o.  G1P1 Long term boyfriend.  Son is 389 yo doing well.  No desire to preserve Fertility.  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  Tried DepoProvera, BCPs without improvement in the past.  Currently experiencing irregular and heavy bleeding.  Until now had monthly heavy menses lastin 8-12 days. The bleeding is accompanied by severe uterine cramps.  No improvement on Megace.  No abnormal vaginal discharge currently.  Recent BV treatment end of 04/2017.  Patient is sexually active, STI screen neg 08/2016.  No urinary tract infection symptoms.  Normal bowel movements.  No fever.  SonoHystero 06/2016 Negative except for blood clot in IU cavity.  Had a Seizure at time of procedure.  On Anticonvulsive therapy now.  Past medical history,surgical history, problem list, medications, allergies, family history and social history were all reviewed and documented in the EPIC chart.  Directed ROS with pertinent positives and negatives documented in the history of present illness/assessment and plan.  Exam:  Vitals:   06/14/17 1221  BP: 130/80   General appearance:  Normal  Pelvic US 02/03/2017: Uterus 9.0 x 5.2 x 3.8 cm with endometrial stripe of 4.2 mm. IUD was seen in the normal position. Right ovary was normal. Previous cyst not seen for 10/09/2016. Left ovary thick wall involuting corpus luteum cyst measuring 19 x 16 mm with positive color flow in the periphery previous cyst of April 2018 not seen. There was some free fluid in the cul-de-sac 18 x 16 mm and 23 x 16 mm slightly above the uterus.   Assessment/Plan:  31 y.o. G1P1   1. Menometrorrhagia Refractory menometrorrhagia and severe dysmenorrhea.  Possible adenomyosis.  No desire to conceive in the future.  Endometrial ablation discussed with patient but prefers definitive surgical  treatment with hysterectomy.  Previous delivery by C-section.  Decision to proceed with laparoscopy assisted vaginal hysterectomy with bilateral salpingectomy.  Remove IUD at time of surgery.  Surgery and risks reviewed thoroughly.   2. Severe dysmenorrhea As above.                        Patient was counseled as to the risk of surgery to include the following:  1. Infection (prohylactic antibiotics will be administered)  2. DVT/Pulmonary Embolism (prophylactic pneumo compression stockings will be used)  3.Trauma to internal organs requiring additional surgical procedure to repair any injury to internal organs requiring perhaps additional hospitalization days.  4.Hemmorhage requiring transfusion and blood products which carry risks such as anaphylactic reaction, hepatitis and AIDS  Patient had received literature information on the procedure scheduled and all her questions were answered and fully accepts all risk.  Counseling on above issues >50% x 15 minutes.  Genia DelMarie-Lyne Leidi Astle MD, 12:37 PM 06/14/2017

## 2017-06-15 NOTE — Patient Instructions (Signed)
Candace Stephens  06/15/2017      Your procedure is scheduled on 06-21-17  [facility]  At     0530 A.M.  Call this number if you have problems the morning of [phone]             OUR ADDRESS IS [address] , WE ARE LOCATED IN THE              [facility].    Remember:  Do not eat food or drink liquids after midnight.  Take these medicines the morning of surgery with A SIP OF WATER : KEPPRA  Do not wear jewelry, make-up or nail polish.  Do not wear lotions, powders, or perfumes, or deoderant.  Do not shave 48 hours prior to surgery.  Men may shave face and neck.  Do not bring valuables to the hospital.  [facility] is not responsible for any belongings or valuables.  Contacts, dentures or bridgework may not be worn into surgery.  Leave your suitcase in the car.  After surgery it may be brought to your room.  For patients admitted to the hospital, discharge time will be determined by your treatment team.   Special instructions:   Please read over the following fact sheets that you were given.    [facility] - Preparing for Surgery Before surgery, you can play an important role.  Because skin is not sterile, your skin needs to be as free of germs as possible.  You can reduce the number of germs on your skin by washing with CHG (chlorahexidine gluconate) soap before surgery.  CHG is an antiseptic cleaner which kills germs and bonds with the skin to continue killing germs even after washing. Please DO NOT use if you have an allergy to CHG or antibacterial soaps.  If your skin becomes reddened/irritated stop using the CHG and inform your nurse when you arrive at Short Stay. Do not shave (including legs and underarms) for at least 48 hours prior to the first CHG shower.  You may shave your face/neck. Please follow these instructions carefully:  1.  Shower with CHG Soap the night before surgery and the  morning of Surgery.  2.  If  you choose to wash your hair, wash your hair first as usual with your  normal  shampoo.  3.  After you shampoo, rinse your hair and body thoroughly to remove the  shampoo.                           4.  Use CHG as you would any other liquid soap.  You can apply chg directly  to the skin and wash                       Gently with a scrungie or clean washcloth.  5.  Apply the CHG Soap to your body ONLY FROM THE NECK DOWN.   Do not use on face/ open                           Wound or open sores. Avoid contact with eyes, ears mouth and genitals (private parts).                       Wash face,  Genitals (private parts) with your normal soap.  6.  Wash thoroughly, paying special attention to the area where your surgery  will be performed.  7.  Thoroughly rinse your body with warm water from the neck down.  8.  DO NOT shower/wash with your normal soap after using and rinsing off  the CHG Soap.                9.  Pat yourself dry with a clean towel.            10.  Wear clean pajamas.            11.  Place clean sheets on your bed the night of your first shower and do not  sleep with pets. Day of Surgery : Do not apply any lotions/deodorants the morning of surgery.  Please wear clean clothes to the hospital/surgery center.  FAILURE TO FOLLOW THESE INSTRUCTIONS MAY RESULT IN THE CANCELLATION OF YOUR SURGERY PATIENT SIGNATURE_________________________________  NURSE SIGNATURE__________________________________  ________________________________________________________________________  WHAT IS A BLOOD TRANSFUSION? Blood Transfusion Information  A transfusion is the replacement of blood or some of its parts. Blood is made up of multiple cells which provide different functions.  Red blood cells carry oxygen and are used for blood loss replacement.  White blood cells fight against infection.  Platelets control bleeding.  Plasma helps clot blood.  Other blood products are available for  specialized needs, such as hemophilia or other clotting disorders. BEFORE THE TRANSFUSION  Who gives blood for transfusions?   Healthy volunteers who are fully evaluated to make sure their blood is safe. This is blood bank blood. Transfusion therapy is the safest it has ever been in the practice of medicine. Before blood is taken from a donor, a complete history is taken to make sure that person has no history of diseases nor engages in risky social behavior (examples are intravenous drug use or sexual activity with multiple partners). The donor's travel history is screened to minimize risk of transmitting infections, such as malaria. The donated blood is tested for signs of infectious diseases, such as HIV and hepatitis. The blood is then tested to be sure it is compatible with you in order to minimize the chance of a transfusion reaction. If you or a relative donates blood, this is often done in anticipation of surgery and is not appropriate for emergency situations. It takes many days to process the donated blood. RISKS AND COMPLICATIONS Although transfusion therapy is very safe and saves many lives, the main dangers of transfusion include:   Getting an infectious disease.  Developing a transfusion reaction. This is an allergic reaction to something in the blood you were given. Every precaution is taken to prevent this. The decision to have a blood transfusion has been considered carefully by your caregiver before blood is given. Blood is not given unless the benefits outweigh the risks. AFTER THE TRANSFUSION  Right after receiving a blood transfusion, you will usually feel much better and more energetic. This is especially true if your red blood cells have gotten low (anemic). The transfusion raises the level of the red blood cells which carry oxygen, and this usually causes an energy increase.  The nurse administering the transfusion will monitor you carefully for complications. HOME CARE  INSTRUCTIONS  No special instructions are needed after a transfusion. You may find your energy is better. Speak with your caregiver about any limitations on activity for underlying diseases you may have. SEEK MEDICAL CARE IF:   Your condition is not improving after your  transfusion.  You develop redness or irritation at the intravenous (IV) site. SEEK IMMEDIATE MEDICAL CARE IF:  Any of the following symptoms occur over the next 12 hours:  Shaking chills.  You have a temperature by mouth above 102 F (38.9 C), not controlled by medicine.  Chest, back, or muscle pain.  People around you feel you are not acting correctly or are confused.  Shortness of breath or difficulty breathing.  Dizziness and fainting.  You get a rash or develop hives.  You have a decrease in urine output.  Your urine turns a dark color or changes to pink, red, or brown. Any of the following symptoms occur over the next 10 days:  You have a temperature by mouth above 102 F (38.9 C), not controlled by medicine.  Shortness of breath.  Weakness after normal activity.  The white part of the eye turns yellow (jaundice).  You have a decrease in the amount of urine or are urinating less often.  Your urine turns a dark color or changes to pink, red, or brown. Document Released: 06/26/2000 Document Revised: 09/21/2011 Document Reviewed: 02/13/2008 ExitCare Patient Information 2014 [employer] [place].  _______________________________________________________________________  Incentive Spirometer  An incentive spirometer is a tool that can help keep your lungs clear and active. This tool measures how well you are filling your lungs with each breath. Taking long deep breaths may help reverse or decrease the chance of developing breathing (pulmonary) problems (especially infection) following:  A long period of time when you are unable to move or be active. BEFORE THE PROCEDURE   If the spirometer includes an  indicator to show your best effort, your nurse or respiratory therapist will set it to a desired goal.  If possible, sit up straight or lean slightly forward. Try not to slouch.  Hold the incentive spirometer in an upright position. INSTRUCTIONS FOR USE  1. Sit on the edge of your bed if possible, or sit up as far as you can in bed or on a chair. 2. Hold the incentive spirometer in an upright position. 3. Breathe out normally. 4. Place the mouthpiece in your mouth and seal your lips tightly around it. 5. Breathe in slowly and as deeply as possible, raising the piston or the ball toward the top of the column. 6. Hold your breath for 3-5 seconds or for as long as possible. Allow the piston or ball to fall to the bottom of the column. 7. Remove the mouthpiece from your mouth and breathe out normally. 8. Rest for a few seconds and repeat Steps 1 through 7 at least 10 times every 1-2 hours when you are awake. Take your time and take a few normal breaths between deep breaths. 9. The spirometer may include an indicator to show your best effort. Use the indicator as a goal to work toward during each repetition. 10. After each set of 10 deep breaths, practice coughing to be sure your lungs are clear. If you have an incision (the cut made at the time of surgery), support your incision when coughing by placing a pillow or rolled up towels firmly against it. Once you are able to get out of bed, walk around indoors and cough well. You may stop using the incentive spirometer when instructed by your caregiver.  RISKS AND COMPLICATIONS  Take your time so you do not get dizzy or light-headed.  If you are in pain, you may need to take or ask for pain medication before doing incentive spirometry. It  is harder to take a deep breath if you are having pain. AFTER USE  Rest and breathe slowly and easily.  It can be helpful to keep track of a log of your progress. Your caregiver can provide you with a simple table  to help with this. If you are using the spirometer at home, follow these instructions: SEEK MEDICAL CARE IF:   You are having difficultly using the spirometer.  You have trouble using the spirometer as often as instructed.  Your pain medication is not giving enough relief while using the spirometer.  You develop fever of 100.5 F (38.1 C) or higher. SEEK IMMEDIATE MEDICAL CARE IF:   You cough up bloody sputum that had not been present before.  You develop fever of 102 F (38.9 C) or greater.  You develop worsening pain at or near the incision site. MAKE SURE YOU:   Understand these instructions.  Will watch your condition.  Will get help right away if you are not doing well or get worse. Document Released: 11/09/2006 Document Revised: 09/21/2011 Document Reviewed: 01/10/2007 [facility] Patient Information 2014 [place], [place].   ________________________________________________________________________

## 2017-06-16 ENCOUNTER — Telehealth: Payer: Self-pay

## 2017-06-16 NOTE — Telephone Encounter (Signed)
Patient is scheduled at 7:30am on Monday [facility] with check-in time of 5:30am.  She was wondering how she will know if they cancel due to weather. Also, do you want to make a plan on how she can let you know if she feels she cannot make it to [facility] due to weather or visa versa?

## 2017-06-17 ENCOUNTER — Encounter (HOSPITAL_COMMUNITY)
Admission: RE | Admit: 2017-06-17 | Discharge: 2017-06-17 | Disposition: A | Discharge status: Home / Self Care | Payer: BC Managed Care – PPO | Source: Ambulatory Visit | Attending: Obstetrics & Gynecology | Admitting: Obstetrics & Gynecology

## 2017-06-17 ENCOUNTER — Encounter (HOSPITAL_COMMUNITY): Payer: Self-pay

## 2017-06-17 ENCOUNTER — Other Ambulatory Visit: Payer: Self-pay

## 2017-06-17 DIAGNOSIS — N921 Excessive and frequent menstruation with irregular cycle: Secondary | ICD-10-CM | POA: Insufficient documentation

## 2017-06-17 DIAGNOSIS — Z01818 Encounter for other preprocedural examination: Secondary | ICD-10-CM | POA: Insufficient documentation

## 2017-06-17 DIAGNOSIS — N946 Dysmenorrhea, unspecified: Secondary | ICD-10-CM | POA: Diagnosis not present

## 2017-06-17 HISTORY — DX: Personal history of urinary calculi: Z87.442

## 2017-06-17 HISTORY — DX: Headache, unspecified: R51.9

## 2017-06-17 HISTORY — DX: Personal history of other venous thrombosis and embolism: Z86.718

## 2017-06-17 HISTORY — DX: Headache: R51

## 2017-06-17 LAB — CBC
HEMATOCRIT: 36.2 % (ref 36.0–46.0)
Hemoglobin: 11.9 g/dL — ABNORMAL LOW (ref 12.0–15.0)
MCH: 27 pg (ref 26.0–34.0)
MCHC: 32.9 g/dL (ref 30.0–36.0)
MCV: 82.3 fL (ref 78.0–100.0)
Platelets: 178 10*3/uL (ref 150–400)
RBC: 4.4 MIL/uL (ref 3.87–5.11)
RDW: 13.1 % (ref 11.5–15.5)
WBC: 2.6 10*3/uL — ABNORMAL LOW (ref 4.0–10.5)

## 2017-06-17 LAB — ABO/RH: ABO/RH(D): B POS

## 2017-06-17 NOTE — Progress Notes (Signed)
CBC done 06-17-17 routed to Dr. Seymour BarsLavoie via epic

## 2017-06-17 NOTE — Progress Notes (Signed)
ekg 06-30-16 epic

## 2017-06-18 NOTE — Telephone Encounter (Signed)
Patient informed. 

## 2017-06-18 NOTE — Telephone Encounter (Signed)
Please say "thanks" to patient from me to be proactive.  If she makes it to the hospital safely, she will have her surgery done.  If she cannot make it safely, tell her to call me directly on my cell phone at [phone].

## 2017-06-20 ENCOUNTER — Other Ambulatory Visit: Payer: Self-pay | Admitting: Neurology

## 2017-06-21 ENCOUNTER — Encounter (HOSPITAL_BASED_OUTPATIENT_CLINIC_OR_DEPARTMENT_OTHER): Admission: RE | Payer: Self-pay | Source: Ambulatory Visit

## 2017-06-21 ENCOUNTER — Ambulatory Visit (HOSPITAL_BASED_OUTPATIENT_CLINIC_OR_DEPARTMENT_OTHER)
Admission: RE | Admit: 2017-06-21 | Payer: BC Managed Care – PPO | Source: Ambulatory Visit | Admitting: Obstetrics & Gynecology

## 2017-06-21 SURGERY — HYSTERECTOMY, VAGINAL, LAPAROSCOPY-ASSISTED, WITH SALPINGECTOMY
Anesthesia: General | Laterality: Bilateral

## 2017-06-22 ENCOUNTER — Telehealth: Payer: Self-pay

## 2017-06-22 NOTE — Telephone Encounter (Signed)
I called patient to talk with her because her surgery scheduled for yesterday was cancelled [facility] because it closed. Patient knew that was our last date for December.  I told her I had reviewed the Dec schedule for both facilities and just don't see a way to fit her in before new year. She was very kind and understanding about the whole situation. She agreed that we will [facility], where hopefully surgery would not be r/s again if weather event.  I will recheck her ins benefits and let her know about change in cost and surgery prepymt amount.

## 2017-06-22 NOTE — Telephone Encounter (Signed)
Thanks

## 2017-06-23 NOTE — Telephone Encounter (Signed)
I called patient back today because we were able to work out surgery for Dec 18 at noon. Patient said she is able to do this. [facility]

## 2017-06-24 ENCOUNTER — Ambulatory Visit: Payer: BC Managed Care – PPO | Admitting: Obstetrics & Gynecology

## 2017-06-28 ENCOUNTER — Inpatient Hospital Stay (HOSPITAL_COMMUNITY): Admission: RE | Admit: 2017-06-28 | Payer: BC Managed Care – PPO | Source: Ambulatory Visit

## 2017-06-28 ENCOUNTER — Encounter: Payer: Self-pay | Admitting: Anesthesiology

## 2017-06-29 ENCOUNTER — Ambulatory Visit (HOSPITAL_COMMUNITY): Payer: BC Managed Care – PPO | Admitting: Certified Registered"

## 2017-06-29 ENCOUNTER — Observation Stay (HOSPITAL_COMMUNITY)
Admission: AD | Admit: 2017-06-29 | Discharge: 2017-06-30 | Disposition: A | Discharge status: Home / Self Care | Payer: BC Managed Care – PPO | Source: Ambulatory Visit | Attending: Obstetrics & Gynecology | Admitting: Obstetrics & Gynecology

## 2017-06-29 ENCOUNTER — Encounter (HOSPITAL_COMMUNITY): Payer: Self-pay | Admitting: Emergency Medicine

## 2017-06-29 ENCOUNTER — Other Ambulatory Visit: Payer: Self-pay

## 2017-06-29 ENCOUNTER — Encounter (HOSPITAL_COMMUNITY)
Admission: AD | Disposition: A | Discharge status: Home / Self Care | Payer: Self-pay | Source: Ambulatory Visit | Attending: Obstetrics & Gynecology

## 2017-06-29 DIAGNOSIS — Z87442 Personal history of urinary calculi: Secondary | ICD-10-CM | POA: Diagnosis not present

## 2017-06-29 DIAGNOSIS — N803 Endometriosis of pelvic peritoneum: Secondary | ICD-10-CM | POA: Insufficient documentation

## 2017-06-29 DIAGNOSIS — Z79899 Other long term (current) drug therapy: Secondary | ICD-10-CM | POA: Diagnosis not present

## 2017-06-29 DIAGNOSIS — J45909 Unspecified asthma, uncomplicated: Secondary | ICD-10-CM | POA: Insufficient documentation

## 2017-06-29 DIAGNOSIS — Z30432 Encounter for removal of intrauterine contraceptive device: Secondary | ICD-10-CM | POA: Diagnosis not present

## 2017-06-29 DIAGNOSIS — Z9889 Other specified postprocedural states: Secondary | ICD-10-CM

## 2017-06-29 DIAGNOSIS — Z888 Allergy status to other drugs, medicaments and biological substances status: Secondary | ICD-10-CM | POA: Diagnosis not present

## 2017-06-29 DIAGNOSIS — Z885 Allergy status to narcotic agent status: Secondary | ICD-10-CM | POA: Insufficient documentation

## 2017-06-29 DIAGNOSIS — N921 Excessive and frequent menstruation with irregular cycle: Secondary | ICD-10-CM | POA: Diagnosis present

## 2017-06-29 DIAGNOSIS — Z23 Encounter for immunization: Secondary | ICD-10-CM | POA: Diagnosis not present

## 2017-06-29 DIAGNOSIS — N946 Dysmenorrhea, unspecified: Secondary | ICD-10-CM | POA: Insufficient documentation

## 2017-06-29 DIAGNOSIS — Z86718 Personal history of other venous thrombosis and embolism: Secondary | ICD-10-CM | POA: Insufficient documentation

## 2017-06-29 DIAGNOSIS — G40909 Epilepsy, unspecified, not intractable, without status epilepticus: Secondary | ICD-10-CM | POA: Insufficient documentation

## 2017-06-29 HISTORY — PX: LAPAROSCOPIC VAGINAL HYSTERECTOMY WITH SALPINGECTOMY: SHX6680

## 2017-06-29 LAB — CBC
HCT: 38.1 % (ref 36.0–46.0)
Hemoglobin: 12.5 g/dL (ref 12.0–15.0)
MCH: 27.2 pg (ref 26.0–34.0)
MCHC: 32.8 g/dL (ref 30.0–36.0)
MCV: 83 fL (ref 78.0–100.0)
Platelets: 160 10*3/uL (ref 150–400)
RBC: 4.59 MIL/uL (ref 3.87–5.11)
RDW: 12.8 % (ref 11.5–15.5)
WBC: 3.2 10*3/uL — ABNORMAL LOW (ref 4.0–10.5)

## 2017-06-29 LAB — ABO/RH: ABO/RH(D): B POS

## 2017-06-29 LAB — TYPE AND SCREEN
ABO/RH(D): B POS
ANTIBODY SCREEN: NEGATIVE

## 2017-06-29 LAB — PREGNANCY, URINE: Preg Test, Ur: NEGATIVE

## 2017-06-29 SURGERY — HYSTERECTOMY, VAGINAL, LAPAROSCOPY-ASSISTED, WITH SALPINGECTOMY
Anesthesia: General | Site: Abdomen | Laterality: Bilateral

## 2017-06-29 MED ORDER — MIDAZOLAM HCL 5 MG/5ML IJ SOLN
INTRAMUSCULAR | Status: DC | PRN
  Administered 2017-06-29: 2 mg via INTRAVENOUS

## 2017-06-29 MED ORDER — LACTATED RINGERS IV SOLN
INTRAVENOUS | Status: DC
  Administered 2017-06-29: 17:00:00 via INTRAVENOUS

## 2017-06-29 MED ORDER — BUPIVACAINE HCL (PF) 0.25 % IJ SOLN
INTRAMUSCULAR | Status: AC
  Filled 2017-06-29: qty 30

## 2017-06-29 MED ORDER — OXYCODONE-ACETAMINOPHEN 5-325 MG PO TABS
1.0000 | ORAL_TABLET | ORAL | Status: DC | PRN
  Administered 2017-06-30 (×2): 1 via ORAL
  Filled 2017-06-29 (×3): qty 1

## 2017-06-29 MED ORDER — ONDANSETRON HCL 4 MG/2ML IJ SOLN
INTRAMUSCULAR | Status: AC
  Filled 2017-06-29: qty 2

## 2017-06-29 MED ORDER — FENTANYL CITRATE (PF) 100 MCG/2ML IJ SOLN
25.0000 ug | INTRAMUSCULAR | Status: DC | PRN
  Administered 2017-06-29 (×4): 50 ug via INTRAVENOUS

## 2017-06-29 MED ORDER — HYDROMORPHONE HCL 1 MG/ML IJ SOLN
0.5000 mg | INTRAMUSCULAR | Status: DC | PRN
  Administered 2017-06-29 (×2): 0.5 mg via INTRAVENOUS
  Filled 2017-06-29 (×3): qty 0.5

## 2017-06-29 MED ORDER — HYDROMORPHONE HCL 1 MG/ML IJ SOLN
INTRAMUSCULAR | Status: DC | PRN
  Administered 2017-06-29 (×2): 0.5 mg via INTRAVENOUS

## 2017-06-29 MED ORDER — IBUPROFEN 600 MG PO TABS
600.0000 mg | ORAL_TABLET | Freq: Four times a day (QID) | ORAL | Status: DC | PRN
  Administered 2017-06-29 – 2017-06-30 (×2): 600 mg via ORAL
  Filled 2017-06-29 (×3): qty 1

## 2017-06-29 MED ORDER — INFLUENZA VAC SPLIT QUAD 0.5 ML IM SUSY
0.5000 mL | PREFILLED_SYRINGE | INTRAMUSCULAR | Status: AC
  Administered 2017-06-30: 0.5 mL via INTRAMUSCULAR
  Filled 2017-06-29: qty 0.5

## 2017-06-29 MED ORDER — LIDOCAINE 2% (20 MG/ML) 5 ML SYRINGE
INTRAMUSCULAR | Status: DC | PRN
  Administered 2017-06-29: 40 mg via INTRAVENOUS

## 2017-06-29 MED ORDER — LEVETIRACETAM ER 500 MG PO TB24
1000.0000 mg | ORAL_TABLET | Freq: Every day | ORAL | Status: DC
  Administered 2017-06-29: 1000 mg via ORAL
  Filled 2017-06-29: qty 2

## 2017-06-29 MED ORDER — SUGAMMADEX SODIUM 200 MG/2ML IV SOLN
INTRAVENOUS | Status: DC | PRN
Start: 2017-06-29 — End: 2017-06-29
  Administered 2017-06-29: 150 mg via INTRAVENOUS

## 2017-06-29 MED ORDER — PROPOFOL 10 MG/ML IV BOLUS
INTRAVENOUS | Status: DC | PRN
  Administered 2017-06-29: 120 mg via INTRAVENOUS

## 2017-06-29 MED ORDER — LIDOCAINE HCL (CARDIAC) 20 MG/ML IV SOLN
INTRAVENOUS | Status: AC
  Filled 2017-06-29: qty 5

## 2017-06-29 MED ORDER — CEFAZOLIN SODIUM-DEXTROSE 2-4 GM/100ML-% IV SOLN
2.0000 g | INTRAVENOUS | Status: AC
  Administered 2017-06-29: 2 g via INTRAVENOUS
  Filled 2017-06-29: qty 100

## 2017-06-29 MED ORDER — ROCURONIUM BROMIDE 10 MG/ML (PF) SYRINGE
PREFILLED_SYRINGE | INTRAVENOUS | Status: DC | PRN
  Administered 2017-06-29: 5 mg via INTRAVENOUS
  Administered 2017-06-29: 45 mg via INTRAVENOUS

## 2017-06-29 MED ORDER — FENTANYL CITRATE (PF) 100 MCG/2ML IJ SOLN
INTRAMUSCULAR | Status: AC
  Filled 2017-06-29: qty 2

## 2017-06-29 MED ORDER — LACTATED RINGERS IV SOLN: INTRAVENOUS | Status: DC

## 2017-06-29 MED ORDER — NORTRIPTYLINE HCL 10 MG PO CAPS
30.0000 mg | ORAL_CAPSULE | Freq: Every day | ORAL | Status: DC
  Administered 2017-06-29: 30 mg via ORAL
  Filled 2017-06-29: qty 3

## 2017-06-29 MED ORDER — LACTATED RINGERS IV SOLN
INTRAVENOUS | Status: DC
  Administered 2017-06-29 (×2): via INTRAVENOUS

## 2017-06-29 MED ORDER — LACTATED RINGERS IR SOLN
Status: DC | PRN
  Administered 2017-06-29: 3000 mL

## 2017-06-29 MED ORDER — HYDROMORPHONE HCL 1 MG/ML IJ SOLN
INTRAMUSCULAR | Status: AC
  Filled 2017-06-29: qty 1

## 2017-06-29 MED ORDER — SCOPOLAMINE 1 MG/3DAYS TD PT72
MEDICATED_PATCH | TRANSDERMAL | Status: AC
  Administered 2017-06-29: 1.5 mg via TRANSDERMAL
  Filled 2017-06-29: qty 1

## 2017-06-29 MED ORDER — LIDOCAINE-EPINEPHRINE 1 %-1:100000 IJ SOLN
INTRAMUSCULAR | Status: DC | PRN
  Administered 2017-06-29: 10 mL

## 2017-06-29 MED ORDER — ONDANSETRON HCL 4 MG/2ML IJ SOLN
INTRAMUSCULAR | Status: DC | PRN
  Administered 2017-06-29: 4 mg via INTRAVENOUS

## 2017-06-29 MED ORDER — SCOPOLAMINE 1 MG/3DAYS TD PT72
1.0000 | MEDICATED_PATCH | Freq: Once | TRANSDERMAL | Status: DC
  Administered 2017-06-29: 1.5 mg via TRANSDERMAL

## 2017-06-29 MED ORDER — PROPOFOL 10 MG/ML IV BOLUS
INTRAVENOUS | Status: AC
  Filled 2017-06-29: qty 20

## 2017-06-29 MED ORDER — CEFAZOLIN SODIUM-DEXTROSE 2-3 GM-%(50ML) IV SOLR
INTRAVENOUS | Status: AC
  Filled 2017-06-29: qty 50

## 2017-06-29 MED ORDER — BUPIVACAINE HCL (PF) 0.25 % IJ SOLN
INTRAMUSCULAR | Status: DC | PRN
  Administered 2017-06-29: 15 mL

## 2017-06-29 MED ORDER — MIDAZOLAM HCL 2 MG/2ML IJ SOLN
INTRAMUSCULAR | Status: AC
  Filled 2017-06-29: qty 2

## 2017-06-29 MED ORDER — ROCURONIUM BROMIDE 100 MG/10ML IV SOLN
INTRAVENOUS | Status: AC
  Filled 2017-06-29: qty 1

## 2017-06-29 MED ORDER — DEXAMETHASONE SODIUM PHOSPHATE 10 MG/ML IJ SOLN
INTRAMUSCULAR | Status: AC
  Filled 2017-06-29: qty 1

## 2017-06-29 MED ORDER — FENTANYL CITRATE (PF) 100 MCG/2ML IJ SOLN
INTRAMUSCULAR | Status: DC | PRN
  Administered 2017-06-29 (×2): 50 ug via INTRAVENOUS

## 2017-06-29 MED ORDER — SUGAMMADEX SODIUM 200 MG/2ML IV SOLN
INTRAVENOUS | Status: AC
  Filled 2017-06-29: qty 2

## 2017-06-29 MED ORDER — LIDOCAINE-EPINEPHRINE 1 %-1:100000 IJ SOLN
INTRAMUSCULAR | Status: AC
  Filled 2017-06-29: qty 1

## 2017-06-29 MED ORDER — PNEUMOCOCCAL VAC POLYVALENT 25 MCG/0.5ML IJ INJ
0.5000 mL | INJECTION | INTRAMUSCULAR | Status: AC
  Administered 2017-06-30: 0.5 mL via INTRAMUSCULAR
  Filled 2017-06-29: qty 0.5

## 2017-06-29 MED ORDER — DEXAMETHASONE SODIUM PHOSPHATE 10 MG/ML IJ SOLN
INTRAMUSCULAR | Status: DC | PRN
  Administered 2017-06-29: 10 mg via INTRAVENOUS

## 2017-06-29 SURGICAL SUPPLY — 46 items
APPLICATOR ARISTA FLEXITIP XL (MISCELLANEOUS) ×3 IMPLANT
CABLE HIGH FREQUENCY MONO STRZ (ELECTRODE) IMPLANT
CONT PATH 16OZ SNAP LID 3702 (MISCELLANEOUS) ×3 IMPLANT
COVER BACK TABLE 60X90IN (DRAPES) ×3 IMPLANT
COVER MAYO STAND STRL (DRAPES) ×3 IMPLANT
DECANTER SPIKE VIAL GLASS SM (MISCELLANEOUS) ×6 IMPLANT
DRSG OPSITE POSTOP 3X4 (GAUZE/BANDAGES/DRESSINGS) ×3 IMPLANT
DURAPREP 26ML APPLICATOR (WOUND CARE) ×3 IMPLANT
ELECT REM PT RETURN 9FT ADLT (ELECTROSURGICAL) ×3
ELECTRODE REM PT RTRN 9FT ADLT (ELECTROSURGICAL) ×1 IMPLANT
FILTER SMOKE EVAC LAPAROSHD (FILTER) ×3 IMPLANT
GLOVE BIO SURGEON STRL SZ 6.5 (GLOVE) ×4 IMPLANT
GLOVE BIO SURGEON STRL SZ7.5 (GLOVE) ×3 IMPLANT
GLOVE BIO SURGEONS STRL SZ 6.5 (GLOVE) ×2
GLOVE BIOGEL PI IND STRL 6.5 (GLOVE) ×1 IMPLANT
GLOVE BIOGEL PI IND STRL 7.0 (GLOVE) ×5 IMPLANT
GLOVE BIOGEL PI INDICATOR 6.5 (GLOVE) ×2
GLOVE BIOGEL PI INDICATOR 7.0 (GLOVE) ×10
HEMOSTAT ARISTA ABSORB 3G PWDR (MISCELLANEOUS) ×3 IMPLANT
LEGGING LITHOTOMY PAIR STRL (DRAPES) ×3 IMPLANT
NEEDLE MAYO CATGUT SZ4 (NEEDLE) IMPLANT
NS IRRIG 1000ML POUR BTL (IV SOLUTION) ×3 IMPLANT
PACK LAVH (CUSTOM PROCEDURE TRAY) ×3 IMPLANT
PACK ROBOTIC GOWN (GOWN DISPOSABLE) ×3 IMPLANT
PACK TRENDGUARD 450 HYBRID PRO (MISCELLANEOUS) ×1 IMPLANT
PAD OB MATERNITY 4.3X12.25 (PERSONAL CARE ITEMS) ×3 IMPLANT
POUCH LAPAROSCOPIC INSTRUMENT (MISCELLANEOUS) ×3 IMPLANT
PROTECTOR NERVE ULNAR (MISCELLANEOUS) ×6 IMPLANT
SCISSORS LAP 5X35 DISP (ENDOMECHANICALS) IMPLANT
SET CYSTO W/LG BORE CLAMP LF (SET/KITS/TRAYS/PACK) ×3 IMPLANT
SET IRRIG TUBING LAPAROSCOPIC (IRRIGATION / IRRIGATOR) ×3 IMPLANT
SHEARS HARMONIC ACE PLUS 36CM (ENDOMECHANICALS) ×3 IMPLANT
SLEEVE XCEL OPT CAN 5 100 (ENDOMECHANICALS) ×3 IMPLANT
SUT PLAIN 4 0 FS 2 27 (SUTURE) ×6 IMPLANT
SUT VIC AB 0 CT1 18XCR BRD8 (SUTURE) ×2 IMPLANT
SUT VIC AB 0 CT1 36 (SUTURE) ×3 IMPLANT
SUT VIC AB 0 CT1 8-18 (SUTURE) ×4
SUT VICRYL 0 TIES 12 18 (SUTURE) ×3 IMPLANT
SUT VICRYL 0 UR6 27IN ABS (SUTURE) ×3 IMPLANT
SYR BULB IRRIGATION 50ML (SYRINGE) ×3 IMPLANT
TOWEL OR 17X24 6PK STRL BLUE (TOWEL DISPOSABLE) ×6 IMPLANT
TRAY FOLEY CATH SILVER 14FR (SET/KITS/TRAYS/PACK) ×3 IMPLANT
TRENDGUARD 450 HYBRID PRO PACK (MISCELLANEOUS) ×3
TROCAR BALLN 12MMX100 BLUNT (TROCAR) ×3 IMPLANT
TROCAR XCEL NON-BLD 5MMX100MML (ENDOMECHANICALS) ×3 IMPLANT
WARMER LAPAROSCOPE (MISCELLANEOUS) ×6 IMPLANT

## 2017-06-29 NOTE — Transfer of Care (Signed)
Immediate Anesthesia Transfer of Care Note  Patient: Candace Stephens  Procedure(s) Performed: LAPAROSCOPIC ASSISTED VAGINAL HYSTERECTOMY WITH SALPINGECTOMY (Bilateral Abdomen)  Patient Location: PACU  Anesthesia Type:General  Level of Consciousness: awake, drowsy and patient cooperative  Airway & Oxygen Therapy: Patient Spontanous Breathing and Patient connected to nasal cannula oxygen  Post-op Assessment: Report given to RN and Post -op Vital signs reviewed and stable  Post vital signs: Reviewed and stable  Last Vitals:  Vitals:   06/29/17 0942  BP: 119/85  Pulse: (!) 104  Resp: 16  Temp: 36.7 C  SpO2: 100%    Last Pain:  Vitals:   06/29/17 0942  TempSrc: Oral      Patients Stated Pain Goal: 4 (06/29/17 0942)  Complications: No apparent anesthesia complications

## 2017-06-29 NOTE — Anesthesia Preprocedure Evaluation (Signed)
Anesthesia Evaluation  Patient identified by MRN, date of birth, ID band Patient awake    Reviewed: Allergy & Precautions, NPO status , Patient's Chart, lab work & pertinent test results  Airway Mallampati: II  TM Distance: >3 FB     Dental   Pulmonary asthma ,    breath sounds clear to auscultation       Cardiovascular negative cardio ROS   Rhythm:Regular Rate:Normal     Neuro/Psych  Headaches, Seizures -,     GI/Hepatic negative GI ROS, Neg liver ROS,   Endo/Other  negative endocrine ROS  Renal/GU negative Renal ROS     Musculoskeletal   Abdominal   Peds  Hematology   Anesthesia Other Findings   Reproductive/Obstetrics                             Anesthesia Physical Anesthesia Plan  ASA: III  Anesthesia Plan: General   Post-op Pain Management:    Induction: Intravenous  PONV Risk Score and Plan: 3 and Ondansetron, Dexamethasone and Midazolam  Airway Management Planned: Oral ETT  Additional Equipment:   Intra-op Plan:   Post-operative Plan: Extubation in OR  Informed Consent: I have reviewed the patients History and Physical, chart, labs and discussed the procedure including the risks, benefits and alternatives for the proposed anesthesia with the patient or authorized representative who has indicated his/her understanding and acceptance.   Dental advisory given  Plan Discussed with: CRNA and Anesthesiologist  Anesthesia Plan Comments:         Anesthesia Quick Evaluation

## 2017-06-29 NOTE — Anesthesia Postprocedure Evaluation (Signed)
Anesthesia Post Note  Patient: Delanna Noticeamara N Stepanek  Procedure(s) Performed: LAPAROSCOPIC ASSISTED VAGINAL HYSTERECTOMY WITH SALPINGECTOMY (Bilateral Abdomen)     Patient location during evaluation: PACU Anesthesia Type: General Level of consciousness: awake and alert Pain management: pain level controlled Vital Signs Assessment: post-procedure vital signs reviewed and stable Respiratory status: spontaneous breathing, nonlabored ventilation, respiratory function stable and patient connected to nasal cannula oxygen Cardiovascular status: blood pressure returned to baseline and stable Postop Assessment: no apparent nausea or vomiting Anesthetic complications: no    Last Vitals:  Vitals:   06/29/17 1600 06/29/17 1615  BP: 115/70 115/72  Pulse: (!) 109 (!) 104  Resp: 14 15  Temp:  37.2 C  SpO2: 99% 99%    Last Pain:  Vitals:   06/29/17 1615  TempSrc:   PainSc: 4    Pain Goal: Patients Stated Pain Goal: 4 (06/29/17 1615)               Rondell Pardon

## 2017-06-29 NOTE — Anesthesia Procedure Notes (Signed)
Procedure Name: Intubation Date/Time: 06/29/2017 12:21 PM Performed by: Vista LawmanEargle, Merisa Julio E, CRNA Pre-anesthesia Checklist: Patient identified, Emergency Drugs available, Suction available and Patient being monitored Patient Re-evaluated:Patient Re-evaluated prior to induction Oxygen Delivery Method: Circle system utilized Preoxygenation: Pre-oxygenation with 100% oxygen Induction Type: IV induction Ventilation: Mask ventilation without difficulty Laryngoscope Size: Miller and 2 Grade View: Grade I Tube type: Oral Tube size: 7.0 mm Number of attempts: 1 Airway Equipment and Method: Stylet Placement Confirmation: ETT inserted through vocal cords under direct vision,  positive ETCO2 and breath sounds checked- equal and bilateral Secured at: 21 cm Tube secured with: Tape Dental Injury: Teeth and Oropharynx as per pre-operative assessment

## 2017-06-29 NOTE — H&P (Signed)
Candace Stephens is an 31 y.o. female. G1P1 Long term boyfriend. Son is 139 yo doing well. No desire to preserve Fertility.  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 Tried DepoProvera, BCPs without improvement in the past.Currently experiencing irregular and heavy bleeding.Until now had monthly heavy menses lastin 8-12 days.The bleeding is accompanied by severe uterine cramps. No improvement on Megace.No abnormal vaginal discharge currently. Recent BV treatment end of 04/2017. Patient is sexually active, STI screen neg 08/2016. No urinary tract infection symptoms. Normal bowel movements. No fever.  SonoHystero 06/2016 Negative except for blood clot in IU cavity. Had a Seizure at time of procedure. On Anticonvulsive therapy now.   Pertinent Gynecological History: Menses: Menometrorrhagia Contraception: IUD Blood transfusions: none Sexually transmitted diseases: no past history Last mammogram: Never Last pap: normal   Menstrual History:  Patient's last menstrual period was 06/14/2017.    Past Medical History:  Diagnosis Date  . Asthma    with colds only no inhaler in a year  . Headache    migraines  . History of blood clots    endometrium  . History of kidney stones   . Seizure disorder [facility](HCC)     Past Surgical History:  Procedure Laterality Date  . APPENDECTOMY    . CESAREAN SECTION    . TONSILLECTOMY      Family History  Problem Relation Age of Onset  . High blood pressure Mother   . GER disease Mother   . Hypertension Mother   . Cervical cancer Maternal Grandmother   . Healthy Maternal Grandfather   . Cervical cancer Paternal Aunt     Social History:  reports that  has never smoked. she has never used smokeless tobacco. She reports that she does not drink alcohol or use drugs.  Allergies:  Allergies  Allergen Reactions  . Hydrocodone Itching   . Sumatriptan Other (See Comments)    Unknown  . Topiramate Other (See Comments)    Kidney stones    Medications Prior to Admission  Medication Sig Dispense Refill Last Dose  . ferrous sulfate 325 (65 FE) MG EC tablet Take 325 mg by mouth daily. At 1630   06/29/2017 at Unknown time  . levETIRAcetam (KEPPRA XR) 500 MG 24 hr tablet TAKE 2 TABLETS(1000 MG) BY MOUTH AT BEDTIME 60 tablet 0 06/28/2017 at Unknown time  . nortriptyline (PAMELOR) 10 MG capsule 30 mg po qhs 90 capsule 5 06/28/2017 at Unknown time  . ibuprofen (ADVIL,MOTRIN) 600 MG tablet TAKE 1 TABLET(600 MG) BY MOUTH EVERY 8 HOURS AS NEEDED (Patient taking differently: TAKE 1 TABLET(600 MG) BY MOUTH EVERY 8 HOURS AS NEEDED PAIN) 60 tablet 0 More than a month at Unknown time    REVIEW OF SYSTEMS: A ROS was performed and pertinent positives and negatives are included in the history.  GENERAL: No fevers or chills. HEENT: No change in vision, no earache, sore throat or sinus congestion. NECK: No pain or stiffness. CARDIOVASCULAR: No chest pain or pressure. No palpitations. PULMONARY: No shortness of breath, cough or wheeze. GASTROINTESTINAL: No abdominal pain, nausea, vomiting or diarrhea, melena or bright red blood per rectum. GENITOURINARY: No urinary frequency, urgency, hesitancy or dysuria. MUSCULOSKELETAL: No joint or muscle pain, no back pain, no recent trauma. DERMATOLOGIC: No rash, no itching, no lesions. ENDOCRINE: No polyuria, polydipsia, no heat or cold intolerance. No recent change in weight. HEMATOLOGICAL: No anemia or easy bruising  or bleeding. NEUROLOGIC: No headache, seizures, numbness, tingling or weakness. PSYCHIATRIC: No depression, no loss of interest in normal activity or change in sleep pattern.     Blood pressure 119/85, pulse (!) 104, temperature 98.1 F (36.7 C), temperature source Oral, resp. rate 16, height 5\' 3"  (1.6 m), weight 124 lb (56.2 kg), last menstrual period 06/14/2017, SpO2 100 %.  Physical  Exam:  See office notes  Results for orders placed or performed during the hospital encounter of 06/29/17 (from the past 24 hour(s))  Pregnancy, urine     Status: None   Collection Time: 06/29/17  9:30 AM  Result Value Ref Range   Preg Test, Ur NEGATIVE NEGATIVE  CBC     Status: Abnormal   Collection Time: 06/29/17  9:35 AM  Result Value Ref Range   WBC 3.2 (L) 4.0 - 10.5 K/uL   RBC 4.59 3.87 - 5.11 MIL/uL   Hemoglobin 12.5 12.0 - 15.0 g/dL   HCT 16.138.1 09.636.0 - 04.546.0 %   MCV 83.0 78.0 - 100.0 fL   MCH 27.2 26.0 - 34.0 pg   MCHC 32.8 30.0 - 36.0 g/dL   RDW 40.912.8 81.111.5 - 91.415.5 %   Platelets 160 150 - 400 K/uL  Type and screen [facility]     Status: None   Collection Time: 06/29/17  9:35 AM  Result Value Ref Range   ABO/RH(D) B POS    Antibody Screen NEG    Sample Expiration 07/02/2017    Pelvic US 02/03/2017:Uterus 9.0 x 5.2 x 3.8 cm with endometrial stripe of 4.2 mm. IUD was seen in the normal position. Right ovary was normal. Previous cyst not seen for 10/09/2016. Left ovary thick wall involuting corpus luteum cyst measuring 19 x 16 mm with positive color flow in the periphery previous cyst of April 2018 not seen. There was some free fluid in the cul-de-sac 18 x 16 mm and 23 x 16 mm slightly above the uterus.   Assessment/Plan:  31 y.o. G1P1   1. Menometrorrhagia Refractory menometrorrhagia and severe dysmenorrhea. Possible adenomyosis. No desire to conceive in the future. Endometrial ablation discussed with patient but prefers definitive surgical treatment with hysterectomy. Previous delivery by C-section.Decision to proceed with laparoscopy assisted vaginal hysterectomy with bilateral salpingectomy.  Remove IUD at time of surgery. Surgery and risks reviewed thoroughly.   2. Severe dysmenorrhea As above.                        Patient was counseled as to the risk of surgery to include the following:  1. Infection (prohylactic antibiotics will be  administered)  2. DVT/Pulmonary Embolism (prophylactic pneumo compression stockings will be used)  3.Trauma to internal organs requiring additional surgical procedure to repair any injury to internal organs requiring perhaps additional hospitalization days.  4.Hemmorhage requiring transfusion and blood products which carry risks such as anaphylactic reaction, hepatitis and AIDS  Patient had received literature information on the procedure scheduled and all her questions were answered and fully accepts all risk.    Marie-Lyne Muhammed Teutsch 06/29/2017, 11:49 AM

## 2017-06-29 NOTE — Op Note (Signed)
Operative Note  06/29/2017  2:47 PM  PATIENT:  Candace Stephens  31 y.o. female  PRE-OPERATIVE DIAGNOSIS:  Severe dysmenorrhea, Menometrorrhagia  POST-OPERATIVE DIAGNOSIS:  Severe dysmenorrhea, Menometrorrhagia, Mild Pelvic Endometriosis  PROCEDURE:  Procedure(s): LAPAROSCOPIC ASSISTED VAGINAL HYSTERECTOMY WITH BILATERAL SALPINGECTOMY  SURGEON:  Surgeon(s): Genia DelLavoie, Marie-Lyne, MD Fontaine, Nadyne Coombesimothy P, MD  ANESTHESIA:   general  FINDINGS:  Uterus with possible Adenomyosis, Mild Pelvic Endometriosis.  Tubes/Ovaries normal.  No other pelvic pathology  DESCRIPTION OF OPERATION: Under general anesthesia with endotracheal intubation the patient is a vasectomy position.  She is prepped with DuraPrep on the abdomen and with Betadine in the suprapubic, vulvar and vaginal areas.  The patient is draped as usual.  The Foley is inserted in the bladder.  The vaginal exam reveals an anteverted uterus normal volume and no adnexal mass.  The speculum is inserted in the vagina.  The anterior lip of the cervix is grasped with a tenaculum.  The Mirena IUD is removed, complete and intact.  The Hulka uterine canula was inserted and the tenaculum was removed from the cervix as well as the speculum.  We go to the abdomen.  A 1.5 cm incision is done infraumbilical Tresa EndoKelly with the scalpel.  Each side is grasped with an Allis.  The aponeurosis is grasped with Coker's.  The aponeurosis is sectioned with Mayo scissors.  The parietal peritoneum is opened bluntly with the finger.  A pursestring stitch of Vicryl 0 was done on the aponeurosis.  The Hossein is inserted at that level under direct vision.  Pneumoperitoneum was created with CO2.  The camera is inserted at that level and the abdominal pelvic cavities are inspected.  No adhesion is present within the anterior wall of the lower abdomen.  We therefore make 5 mm incisions on either side after infiltrating with Marcaine plain 1/4% at each site.  5 mm ports are inserted  under direct vision on either side.  We complete the inspection of the abdominal pelvic cavities.  No abnormality is seen in the abdomen.  The patient is status post appendectomy.  The uterus appears soft and irregular which is compatible with adenomyosis.  Both tubes are normal.  Both ovaries are normal.  Lesions of endometriosis are seen bilaterally at the uterosacral ligaments.  No other lesion of endometriosis is seen.  Mild adhesions between the bladder and the lower uterine segment are present, probably from the patient's previous C-section.  Both ureters are seen in normal anatomic position with good peristalsis.  We start on the right side with cauterization and section of the right mesosalpinx using the harmonic scalpel.  We then cauterized and sectioned the right round ligament, the right utero-ovarian ligament and continued on the right broad ligament close to the uterus.  We stopped just before the right uterine artery.  We opened the visceral peritoneum anteriorly.  We proceeded exactly the same way on the left side.  We lowered the bladder on the midline.  Hemostasis was controlled on the right uterine border with the bipolar clamp.  Hemostasis was adequate at all levels.  All laparoscopic instruments were removed.  We covered the abdomen with a sterile drape.  We then went to vaginal time.    The weighted speculum was inserted in the vagina.  We grasped the cervix with tenaculums.  The junction between vagina and cervix was infiltrated with lidocaine 1% and epinephrine.  We then made a circumferential incision starting anteriorly and finishing anteriorly clockwise at the junction of the  vagina and cervix.  We pushed the vaginal mucosa anteriorly, but did not open the visceral peritoneum at that time.  We went posteriorly and successfully open the visceral peritoneum.  We replaced the weighted speculum with a long narrow weighted speculum.  We then clamped the left cardinal ligaments with a curved  Heaney, sectioned with the Mayo scissors and sutured with Vicryl 0.  We clamped the left uterosacral ligament with a curved Heaney, sectioned with Mayo scissors and sutured with a Heaney stitch of Vicryl 0 which we did On a Hemostat.  We proceeded the same way on the right side.  At this point we were able to safely open the visceral peritoneum anteriorly and reclined the bladder with the retractor.  We clamped the left uterine artery with the curved Heaney, section with Mayo scissors and sutured with a Vicryl 0.  We proceeded the same way on the right side.  We put a last curved Heaney on the left and right side sectioned with Mayo scissors and removed the specimen, consisting of the uterus with cervix and both tubes.  The specimen was sent to pathology.  We then sutured each of those pedicles with a Vicryl 0.  Hemostasis was adequate at all levels.  We then ran a locked suture of Vicryl 0 at the posterior vagina.  We closed the vaginal vault including the visceral peritoneum with figure-of-eight's of Vicryl 0.  The vaginal vault was was well closed and hemostasis was adequate.  All instruments were removed from the vagina.  We then went back to laparoscopy time.      We recreated the pneumoperitoneum with CO2.  Irrigated and suctioned the abdominal pelvic cavities.  Controlled minor bleeders with the bipolar at the anterior left peritoneum.  Pictures were taken before the procedure and at the end.  All laparoscopic instruments were removed.  The ports were removed under direct vision.  The CO2 was evacuated.  The infraumbilical incision was closed by attaching the pursestring stitch at the aponeurosis.  We then put a separate stitch on the adipose tissue.  We closed the skin at the infraumbilical and incision with this subcuticular suture of Vicryl 4-0.  We closed the two 5 mm incisions on either side with a single stitch of Vicryl 4-0.  We added Dermabond on the 3 incisions.  The patient was brought to recovery  room in good and stable status.  ESTIMATED BLOOD LOSS: 75 mL   Intake/Output Summary (Last 24 hours) at 06/29/2017 1447 Last data filed at 06/29/2017 1432 Gross per 24 hour  Intake 1200 ml  Output 200 ml  Net 1000 ml     BLOOD ADMINISTERED:none   LOCAL MEDICATIONS USED:  MARCAINE     SPECIMEN:  Source of Specimen:  Uterus with cervix and bilateral tubes  DISPOSITION OF SPECIMEN:  PATHOLOGY  COUNTS:  YES  PLAN OF CARE: Transfer to PACU  Marie-Lyne LavoieMD2:47 PM

## 2017-06-30 ENCOUNTER — Encounter (HOSPITAL_COMMUNITY): Payer: Self-pay | Admitting: Obstetrics & Gynecology

## 2017-06-30 DIAGNOSIS — N921 Excessive and frequent menstruation with irregular cycle: Secondary | ICD-10-CM | POA: Diagnosis not present

## 2017-06-30 LAB — TYPE AND SCREEN
ABO/RH(D): B POS
Antibody Screen: NEGATIVE

## 2017-06-30 LAB — CBC
HEMATOCRIT: 33.1 % — AB (ref 36.0–46.0)
Hemoglobin: 10.9 g/dL — ABNORMAL LOW (ref 12.0–15.0)
MCH: 27.3 pg (ref 26.0–34.0)
MCHC: 32.9 g/dL (ref 30.0–36.0)
MCV: 82.8 fL (ref 78.0–100.0)
PLATELETS: 154 10*3/uL (ref 150–400)
RBC: 4 MIL/uL (ref 3.87–5.11)
RDW: 12.8 % (ref 11.5–15.5)
WBC: 8.2 10*3/uL (ref 4.0–10.5)

## 2017-06-30 MED ORDER — OXYCODONE-ACETAMINOPHEN 7.5-325 MG PO TABS: 1.0000 | ORAL_TABLET | Freq: Four times a day (QID) | ORAL | 0 refills | Status: DC | PRN

## 2017-06-30 NOTE — Discharge Summary (Signed)
Physician Discharge Summary  Patient ID: Candace Stephens MRN: [id number] DOB/AGE: 12/18/1985 31 y.o.  Admit date: 06/29/2017 Discharge date: 06/30/2017  Admission Diagnoses: severe dysmenorrhea, menometrorrhagia   Discharge Diagnoses: Same and Mild Pelvic Endometriosis Active Problems:   Post-operative state   Discharged Condition: good  Consults:None  Significant Diagnostic Studies: None  Treatments:surgery: Laparoscopy assisted vaginal hysterectomy with bilateral salpingectomy and cauterization of endometriosis  Vitals:   06/30/17 0450 06/30/17 0800  BP: 120/67 118/75  Pulse: 64 92  Resp: 17 18  Temp: 98.4 F (36.9 C) (!) 97.5 F (36.4 C)  SpO2: 100% 100%     No intake/output data recorded.   Hospital Course: Good  Discharge Exam: Good  Disposition: 01-Home or Self Care    Allergies as of 06/30/2017      Reactions   Hydrocodone Itching   Sumatriptan Other (See Comments)   Unknown   Topiramate Other (See Comments)   Kidney stones      Medication List    TAKE these medications   ferrous sulfate 325 (65 FE) MG EC tablet Take 325 mg by mouth daily. At 1630   ibuprofen 600 MG tablet Commonly known as:  ADVIL,MOTRIN TAKE 1 TABLET(600 MG) BY MOUTH EVERY 8 HOURS AS NEEDED What changed:  See the new instructions.   levETIRAcetam 500 MG 24 hr tablet Commonly known as:  KEPPRA XR TAKE 2 TABLETS(1000 MG) BY MOUTH AT BEDTIME   nortriptyline 10 MG capsule Commonly known as:  PAMELOR 30 mg po qhs   oxyCODONE-acetaminophen 7.5-325 MG tablet Commonly known as:  PERCOCET Take 1 tablet by mouth every 6 (six) hours as needed for severe pain.        Follow-up Information    Genia DelLavoie, Marie-Lyne, MD Follow up in 3 week(s).   Specialty:  Obstetrics and Gynecology Contact information: [address] [place] [address] [phone]            Signed: Genia DelMarie-Lyne Sibbie Flammia 06/30/2017, 9:12 AM

## 2017-06-30 NOTE — Progress Notes (Signed)
Teaching complete  Pt out in wheelchair  With  Significant other

## 2017-06-30 NOTE — Discharge Summary (Signed)
POD#1  Subjective: Patient reports tolerating PO, + flatus and no problems voiding.    Objective: I have reviewed patient's vital signs.  vital signs, intake and output, medications and labs.  Vitals:   06/30/17 0450 06/30/17 0800  BP: 120/67 118/75  Pulse: 64 92  Resp: 17 18  Temp: 98.4 F (36.9 C) (!) 97.5 F (36.4 C)  SpO2: 100% 100%   I/O last 3 completed shifts: In: 2223.8 [P.O.:480; I.V.:1643.8; IV Piggyback:100] Out: 775 [Urine:700; Blood:75] No intake/output data recorded.  Results for orders placed or performed during the hospital encounter of 06/29/17 (from the past 24 hour(s))  Pregnancy, urine     Status: None   Collection Time: 06/29/17  9:30 AM  Result Value Ref Range   Preg Test, Ur NEGATIVE NEGATIVE  CBC     Status: Abnormal   Collection Time: 06/29/17  9:35 AM  Result Value Ref Range   WBC 3.2 (L) 4.0 - 10.5 K/uL   RBC 4.59 3.87 - 5.11 MIL/uL   Hemoglobin 12.5 12.0 - 15.0 g/dL   HCT 09.638.1 04.536.0 - 40.946.0 %   MCV 83.0 78.0 - 100.0 fL   MCH 27.2 26.0 - 34.0 pg   MCHC 32.8 30.0 - 36.0 g/dL   RDW 81.112.8 91.411.5 - 78.215.5 %   Platelets 160 150 - 400 K/uL  Type and screen [facility]     Status: None   Collection Time: 06/29/17  9:35 AM  Result Value Ref Range   ABO/RH(D) B POS    Antibody Screen NEG    Sample Expiration 07/02/2017   ABO/Rh     Status: None   Collection Time: 06/29/17  9:35 AM  Result Value Ref Range   ABO/RH(D) B POS   CBC     Status: Abnormal   Collection Time: 06/30/17  6:29 AM  Result Value Ref Range   WBC 8.2 4.0 - 10.5 K/uL   RBC 4.00 3.87 - 5.11 MIL/uL   Hemoglobin 10.9 (L) 12.0 - 15.0 g/dL   HCT 95.633.1 (L) 21.336.0 - 08.646.0 %   MCV 82.8 78.0 - 100.0 fL   MCH 27.3 26.0 - 34.0 pg   MCHC 32.9 30.0 - 36.0 g/dL   RDW 57.812.8 46.911.5 - 62.915.5 %   Platelets 154 150 - 400 K/uL    EXAM General: alert and cooperative Resp: clear to auscultation bilaterally Cardio: regular rate and rhythm GI: soft, non-tender; bowel sounds normal; no  masses,  no organomegaly and incision: clean, dry and intact Extremities: no edema, redness or tenderness in the calves or thighs Vaginal Bleeding: none  Assessment: s/p Procedure(s): LAPAROSCOPIC ASSISTED VAGINAL HYSTERECTOMY WITH SALPINGECTOMY: stable, progressing well and tolerating diet  Plan: Discharge home  LOS: 0 days    Genia DelMarie-Lyne Oshay Stranahan, MD 06/30/2017 9:11 AM

## 2017-06-30 NOTE — Discharge Instructions (Signed)
Laparoscopically Assisted Vaginal Hysterectomy, Care After °Refer to this sheet in the next few weeks. These instructions provide you with information on caring for yourself after your procedure. Your health care provider may also give you more specific instructions. Your treatment has been planned according to current medical practices, but problems sometimes occur. Call your health care provider if you have any problems or questions after your procedure. °What can I expect after the procedure? °After your procedure, it is typical to have the following: °· Abdominal pain. You will be given pain medicine to control it. °· Sore throat from the breathing tube that was inserted during surgery. ° °Follow these instructions at home: °· Only take over-the-counter or prescription medicines for pain, discomfort, or fever as directed by your health care provider. °· Do not take aspirin. It can cause bleeding. °· Do not drive when taking pain medicine. °· Follow your health care provider's advice regarding diet, exercise, lifting, driving, and general activities. °· Resume your usual diet as directed and allowed. °· Get plenty of rest and sleep. °· Do not douche, use tampons, or have sexual intercourse for at least 6 weeks, or until your health care provider gives you permission. °· Change your bandages (dressings) as directed by your health care provider. °· Monitor your temperature and notify your health care provider of a fever. °· Take showers instead of baths for 2-3 weeks. °· Do not drink alcohol until your health care provider gives you permission. °· If you develop constipation, you may take a mild laxative with your health care provider's permission. Bran foods may help with constipation problems. Drinking enough fluids to keep your urine clear or pale yellow may help as well. °· Try to have someone home with you for 1-2 weeks to help around the house. °· Keep all of your follow-up appointments as directed by your  health care provider. °Contact a health care provider if: °· You have swelling, redness, or increasing pain around your incision sites. °· You have pus coming from your incision. °· You notice a bad smell coming from your incision. °· Your incision breaks open. °· You feel dizzy or lightheaded. °· You have pain or bleeding when you urinate. °· You have persistent diarrhea. °· You have persistent nausea and vomiting. °· You have abnormal vaginal discharge. °· You have a rash. °· You have any type of abnormal reaction or develop an allergy to your medicine. °· You have poor pain control with your prescribed medicine. °Get help right away if: °· You have a fever. °· You have severe abdominal pain. °· You have chest pain. °· You have shortness of breath. °· You faint. °· You have pain, swelling, or redness in your leg. °· You have heavy vaginal bleeding with blood clots. °This information is not intended to replace advice given to you by your health care provider. Make sure you discuss any questions you have with your health care provider. °Document Released: 06/18/2011 Document Revised: 12/05/2015 Document Reviewed: 01/12/2013 °Elsevier Interactive Patient Education © 2017 [employer] ° °

## 2017-07-02 ENCOUNTER — Ambulatory Visit: Payer: BC Managed Care – PPO | Admitting: Neurology

## 2017-07-02 ENCOUNTER — Encounter: Payer: Self-pay | Admitting: Anesthesiology

## 2017-07-07 ENCOUNTER — Encounter: Payer: Self-pay | Admitting: Obstetrics & Gynecology

## 2017-07-09 ENCOUNTER — Encounter: Payer: Self-pay | Admitting: Obstetrics & Gynecology

## 2017-07-09 ENCOUNTER — Ambulatory Visit: Payer: BC Managed Care – PPO | Admitting: Obstetrics & Gynecology

## 2017-07-09 VITALS — BP 142/86

## 2017-07-09 DIAGNOSIS — R300 Dysuria: Secondary | ICD-10-CM

## 2017-07-09 DIAGNOSIS — R102 Pelvic and perineal pain: Secondary | ICD-10-CM

## 2017-07-09 LAB — CBC WITH DIFFERENTIAL/PLATELET
BASOS PCT: 0.6 %
Basophils Absolute: 30 cells/uL (ref 0–200)
EOS ABS: 40 {cells}/uL (ref 15–500)
Eosinophils Relative: 0.8 %
HEMATOCRIT: 33.6 % — AB (ref 35.0–45.0)
Hemoglobin: 11.1 g/dL — ABNORMAL LOW (ref 11.7–15.5)
LYMPHS ABS: 1755 {cells}/uL (ref 850–3900)
MCH: 27 pg (ref 27.0–33.0)
MCHC: 33 g/dL (ref 32.0–36.0)
MCV: 81.8 fL (ref 80.0–100.0)
MPV: 10.3 fL (ref 7.5–12.5)
Monocytes Relative: 7.7 %
Neutro Abs: 2790 cells/uL (ref 1500–7800)
Neutrophils Relative %: 55.8 %
Platelets: 216 10*3/uL (ref 140–400)
RBC: 4.11 10*6/uL (ref 3.80–5.10)
RDW: 12.4 % (ref 11.0–15.0)
Total Lymphocyte: 35.1 %
WBC: 5 10*3/uL (ref 3.8–10.8)
WBCMIX: 385 {cells}/uL (ref 200–950)

## 2017-07-09 MED ORDER — SULFAMETHOXAZOLE-TRIMETHOPRIM 800-160 MG PO TABS
1.0000 | ORAL_TABLET | Freq: Two times a day (BID) | ORAL | 0 refills | Status: AC
Start: 2017-07-09 — End: 2017-07-12

## 2017-07-09 MED ORDER — AMOXICILLIN-POT CLAVULANATE 875-125 MG PO TABS: 1.0000 | ORAL_TABLET | Freq: Two times a day (BID) | ORAL | 0 refills | Status: AC

## 2017-07-09 NOTE — Patient Instructions (Signed)
1. Dysuria Symptoms and urine analysis compatible with cystitis.  Will treat with Bactrim pending urine culture.  Usage reviewed.  Precaution discussed to call back for reevaluation if no improvement after 24 hours. - Urinalysis with Culture Reflex - CBC w/Diff  2. Pelvic pain in female Postop pelvic pain.  No acute abdomen.  Possible small pelvic abscess.  Will complete evaluation with a pelvic ultrasound if leukocytosis is present.  Will cover with antibiotics.  Starting Augmentin twice a day for 10 days.  Usage discussed.  Will call back for reevaluation if no improvement after 48 hours.  During my vacation will be evaluated by Dr. Audie BoxFontaine as needed. - CBC w/Diff  Other orders - sulfamethoxazole-trimethoprim (BACTRIM DS,SEPTRA DS) 800-160 MG tablet; Take 1 tablet by mouth 2 (two) times daily for 3 days. - amoxicillin-clavulanate (AUGMENTIN) 875-125 MG tablet; Take 1 tablet by mouth 2 (two) times daily for 10 days.  Candace Stephens, good seeing you today!  I will inform you of your results as soon as available.

## 2017-07-09 NOTE — Progress Notes (Signed)
    Candace Stephens 1985/09/07 [id number]        31 y.o.  G1P1   RP: Complaining of blood in urine with lower back pain and shooting pain in the pelvic area for 4 days  HPI:  LAVH/Bilateral Salpingectomy 06/29/2017.  Patient was doing well postop until 4 days ago, when she started having more pelvic pain and lower back pain with ambulation.  Also having burning pain with micturition and frequency.  No vaginal bleeding.  No fever.  Eating normally, no vomiting and passing normal bowel movements.  Only on ibuprofen as needed at this point.  Past medical history,surgical history, problem list, medications, allergies, family history and social history were all reviewed and documented in the EPIC chart.  Directed ROS with pertinent positives and negatives documented in the history of present illness/assessment and plan.  Exam:  Vitals:   07/09/17 0929  BP: (!) 142/86   General appearance:  Normal  CVAT negative bilaterally  Abdomen: Soft, no rebound.  Mildly tender at the suprapubic area.  Gynecologic exam: Vulva normal.  Speculum: Vaginal vault healing well, no [place] since.  No bleeding and no abnormal discharge.  Bimanual exam done with precaution.  No pelvic mass felt, but tender at the vaginal vault anteriorly.  U/A: Clear, white blood cells 10-20, red blood cells 3-10, bacteria moderate, nitrites negative.   Assessment/Plan:  [age] y.o. G1P1   1. Dysuria Symptoms and urine analysis compatible with cystitis.  Will treat with Bactrim pending urine culture.  Usage reviewed.  Precaution discussed to call back for reevaluation if no improvement after 24 hours. - Urinalysis with Culture Reflex - CBC w/Diff  2. Pelvic pain in female Postop pelvic pain.  No acute abdomen.  Possible small pelvic abscess.  Will complete evaluation with a pelvic ultrasound if leukocytosis is present.  Will cover with antibiotics.  Starting Augmentin twice a day for 10 days.  Usage discussed.  Will call back  for reevaluation if no improvement after 48 hours.  During my vacation will be evaluated by Dr. Audie BoxFontaine as needed. - CBC w/Diff  Other orders - sulfamethoxazole-trimethoprim (BACTRIM DS,SEPTRA DS) 800-160 MG tablet; Take 1 tablet by mouth 2 (two) times daily for 3 days. - amoxicillin-clavulanate (AUGMENTIN) 875-125 MG tablet; Take 1 tablet by mouth 2 (two) times daily for 10 days.  Counseling on above issues more than 50% for 15 minutes.  Genia DelMarie-Lyne Eboni Coval MD, 9:52 AM 07/09/2017

## 2017-07-11 LAB — URINALYSIS W MICROSCOPIC + REFLEX CULTURE
BILIRUBIN URINE: NEGATIVE
Glucose, UA: NEGATIVE
HYALINE CAST: NONE SEEN /LPF
Ketones, ur: NEGATIVE
NITRITES URINE, INITIAL: NEGATIVE
PH: 6.5 (ref 5.0–8.0)
Protein, ur: NEGATIVE
SPECIFIC GRAVITY, URINE: 1.015 (ref 1.001–1.03)

## 2017-07-11 LAB — URINE CULTURE
MICRO NUMBER: 81461630
Result:: NO GROWTH
SPECIMEN QUALITY: ADEQUATE

## 2017-07-11 LAB — CULTURE INDICATED

## 2017-07-22 ENCOUNTER — Ambulatory Visit (INDEPENDENT_AMBULATORY_CARE_PROVIDER_SITE_OTHER): Payer: BC Managed Care – PPO | Admitting: Obstetrics & Gynecology

## 2017-07-22 ENCOUNTER — Encounter: Payer: Self-pay | Admitting: Obstetrics & Gynecology

## 2017-07-22 VITALS — BP 134/86

## 2017-07-22 DIAGNOSIS — Z09 Encounter for follow-up examination after completed treatment for conditions other than malignant neoplasm: Secondary | ICD-10-CM

## 2017-07-22 NOTE — Patient Instructions (Signed)
1. Follow-up examination after gynecological surgery Good postop healing with no complication.  Patient will follow up in 3-4 weeks for final evaluation and annual gynecologic exam.  Will resume work at 6 weeks postop.  Candace Stephens, good seeing you today!

## 2017-07-22 NOTE — Progress Notes (Signed)
    Candace Stephens 1986-01-13 [id number]        32 y.o.  G1P1 Teacher in a pre-k school  RP:  3 weeks Postop LAVH  HPI: Doing very well with no abdominal pelvic pain.  No vaginal bleeding.  No vaginal discharge.  No fever.  No urinary tract infection symptoms and normal bowel movements.  Past medical history,surgical history, problem list, medications, allergies, family history and social history were all reviewed and documented in the EPIC chart.  Directed ROS with pertinent positives and negatives documented in the history of present illness/assessment and plan.  Exam:  Vitals:   07/22/17 1618  BP: 134/86   General appearance:  Normal  Gynecologic exam: Vulva normal.  Speculum exam: Vaginal vault healing well.  No dehiscence.  No sign of infection.  No bleeding.  Assessment/Plan:  32 y.o. G1P1   1. Follow-up examination after gynecological surgery Good postop healing with no complication.  Patient will follow up in 3-4 weeks for final evaluation and annual gynecologic exam.  Will resume work at 6 weeks postop.  Genia DelMarie-Lyne Ronella Plunk MD, 4:39 PM 07/22/2017

## 2017-07-23 ENCOUNTER — Other Ambulatory Visit: Payer: Self-pay | Admitting: Neurology

## 2017-07-23 ENCOUNTER — Encounter: Payer: Self-pay | Admitting: Neurology

## 2017-07-23 ENCOUNTER — Encounter: Payer: Self-pay | Admitting: Obstetrics & Gynecology

## 2017-07-23 ENCOUNTER — Ambulatory Visit: Payer: BC Managed Care – PPO | Admitting: Neurology

## 2017-07-23 VITALS — BP 120/78 | HR 102 | Ht 63.0 in | Wt 130.5 lb

## 2017-07-23 DIAGNOSIS — G5622 Lesion of ulnar nerve, left upper limb: Secondary | ICD-10-CM

## 2017-07-23 DIAGNOSIS — G40909 Epilepsy, unspecified, not intractable, without status epilepticus: Secondary | ICD-10-CM

## 2017-07-23 DIAGNOSIS — G43009 Migraine without aura, not intractable, without status migrainosus: Secondary | ICD-10-CM

## 2017-07-23 DIAGNOSIS — G238 Other specified degenerative diseases of basal ganglia: Secondary | ICD-10-CM

## 2017-07-23 MED ORDER — NORTRIPTYLINE HCL 50 MG PO CAPS: 50.0000 mg | ORAL_CAPSULE | Freq: Every day | ORAL | 11 refills | Status: DC

## 2017-07-23 NOTE — Patient Instructions (Signed)
1.  NCS/EMG of the left arm 2.  Increase nortriptyline to 50mg  at bedtime 3.  Continue Keppra 1000mg /d  Return to clinic 6 months
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
## 2017-07-26 ENCOUNTER — Encounter: Payer: Self-pay | Admitting: Anesthesiology

## 2017-07-27 ENCOUNTER — Ambulatory Visit (INDEPENDENT_AMBULATORY_CARE_PROVIDER_SITE_OTHER): Payer: BC Managed Care – PPO | Admitting: Neurology

## 2017-07-27 DIAGNOSIS — G5622 Lesion of ulnar nerve, left upper limb: Secondary | ICD-10-CM

## 2017-07-27 NOTE — Procedures (Signed)
[facility]  [address], [address]  [place], [place] [address] Tel: [phone] Fax:  [phone] Test Date:  07/27/2017  Patient: Candace Stephens DOB: 08/15/1985 Physician: Nita Sickleonika Friend Dorfman, DO  Sex: Female Height: 5\' 3"  Ref Phys: Nita Sickleonika Tylek Boney, DO  ID#: [id number] Temp: 32.8C Technician:    Patient Complaints: This is a [age] year old female referred for evaluation of left hand paresthesias.  NCV & EMG Findings: Extensive electrodiagnostic testing of the left upper extremity shows:  1. Left median, ulnar, and mixed palmar sensory responses are within normal limits. 2. Left median and ulnar motor responses are within normal limits. 3. There is no evidence of active or chronic motor axon loss changes affecting any of the tested muscles. Motor unit configuration and recruitment pattern is within normal limits.  Impression: This is a normal study of the left upper extremity. In particular, there is no evidence of carpal tunnel syndrome, ulnar neuropathy, or cervical radiculopathy.   ___________________________ Nita Sickleonika Elden Brucato, DO    Nerve Conduction Studies Anti Sensory Summary Table   Site NR Peak (ms) Norm Peak (ms) P-T Amp (V) Norm P-T Amp  Left Median Anti Sensory (2nd Digit)  32.8C  Wrist    3.2 <3.4 45.3 >20  Left Ulnar Anti Sensory (5th Digit)  32.8C  Wrist    3.0 <3.1 42.4 >12   Motor Summary Table   Site NR Onset (ms) Norm Onset (ms) O-P Amp (mV) Norm O-P Amp Site1 Site2 Delta-0 (ms) Dist (cm) Vel (m/s) Norm Vel (m/s)  Left Median Motor (Abd Poll Brev)  32.8C  Wrist    3.5 <3.9 16.4 >6 Elbow Wrist 4.3 28.0 65 >50  Elbow    7.8  16.0         Left Ulnar Motor (Abd Dig Minimi)  32.8C  Wrist    2.4 <3.1 10.8 >7 B Elbow Wrist 3.7 23.0 62 >50  B Elbow    6.1  10.7  A Elbow B Elbow 1.7 10.0 59 >50  A Elbow    7.8  10.2          Comparison Summary Table   Site NR Peak (ms) Norm Peak (ms) P-T Amp (V) Site1 Site2 Delta-P (ms) Norm Delta (ms)  Left Median/Ulnar  Palm Comparison (Wrist - 8cm)  32.8C  Median Palm    1.6 <2.2 65.2 Median Palm Ulnar Palm 0.1   Ulnar Palm    1.5 <2.2 26.1       EMG   Side Muscle Ins Act Fibs Psw Fasc Number Recrt Dur Dur. Amp Amp. Poly Poly. Comment  Left 1stDorInt Nml Nml Nml Nml Nml Nml Nml Nml Nml Nml Nml Nml N/A  Left PronatorTeres Nml Nml Nml Nml Nml Nml Nml Nml Nml Nml Nml Nml N/A  Left Biceps Nml Nml Nml Nml Nml Nml Nml Nml Nml Nml Nml Nml N/A  Left Triceps Nml Nml Nml Nml Nml Nml Nml Nml Nml Nml Nml Nml N/A  Left Deltoid Nml Nml Nml Nml Nml Nml Nml Nml Nml Nml Nml Nml N/A      Waveforms:

## 2017-07-28 ENCOUNTER — Telehealth: Payer: Self-pay | Admitting: Neurology

## 2017-07-28 ENCOUNTER — Other Ambulatory Visit: Payer: Self-pay | Admitting: *Deleted

## 2017-07-28 ENCOUNTER — Telehealth: Payer: Self-pay | Admitting: *Deleted

## 2017-07-28 DIAGNOSIS — R200 Anesthesia of skin: Secondary | ICD-10-CM

## 2017-07-28 NOTE — Telephone Encounter (Signed)
Pt left a message saying she wanted a call back from nurse Morrie SheldonAshley

## 2017-07-28 NOTE — Telephone Encounter (Signed)
Left message giving patient results and instructions.  Order placed for B12 lab.

## 2017-07-28 NOTE — Telephone Encounter (Signed)
-----   Message from [place] Chardonika K Patel, DO sent at 07/27/2017  4:02 PM EST ----- Please inform pt that nerve testing is normal and does not show any nerve impingement or carpal tunnel syndrome.  No clear reason for her tingling.  Let's check vitamin B12 level to be sure its not due to nutrient deficiency.  Thanks.

## 2017-07-29 ENCOUNTER — Encounter: Payer: Self-pay | Admitting: Obstetrics & Gynecology

## 2017-07-30 ENCOUNTER — Other Ambulatory Visit: Payer: Self-pay | Admitting: *Deleted

## 2017-07-30 DIAGNOSIS — R200 Anesthesia of skin: Secondary | ICD-10-CM

## 2017-07-30 NOTE — Telephone Encounter (Signed)
Patient will come in on Monday for lab work.

## 2017-08-02 ENCOUNTER — Other Ambulatory Visit (INDEPENDENT_AMBULATORY_CARE_PROVIDER_SITE_OTHER): Payer: BC Managed Care – PPO

## 2017-08-02 DIAGNOSIS — R200 Anesthesia of skin: Secondary | ICD-10-CM | POA: Diagnosis not present

## 2017-08-02 LAB — VITAMIN B12: Vitamin B-12: 326 pg/mL (ref 211–911)

## 2017-08-03 ENCOUNTER — Telehealth: Payer: Self-pay | Admitting: *Deleted

## 2017-08-03 NOTE — Telephone Encounter (Signed)
Patient given results and instructions.   

## 2017-08-03 NOTE — Telephone Encounter (Signed)
-----   Message from Octaviano Battyebecca S Tat, DO sent at 08/02/2017  2:11 PM EST ----- Let pt know that B12 just a little low and have take supplement of 1000mcg daily.

## 2017-08-10 ENCOUNTER — Encounter: Payer: Self-pay | Admitting: Obstetrics & Gynecology

## 2017-08-10 ENCOUNTER — Encounter: Payer: BC Managed Care – PPO | Admitting: Neurology

## 2017-08-10 ENCOUNTER — Ambulatory Visit (INDEPENDENT_AMBULATORY_CARE_PROVIDER_SITE_OTHER): Payer: BC Managed Care – PPO | Admitting: Obstetrics & Gynecology

## 2017-08-10 VITALS — BP 122/78 | Ht 63.0 in | Wt 132.0 lb

## 2017-08-10 DIAGNOSIS — Z01411 Encounter for gynecological examination (general) (routine) with abnormal findings: Secondary | ICD-10-CM | POA: Diagnosis not present

## 2017-08-10 DIAGNOSIS — Z90710 Acquired absence of both cervix and uterus: Secondary | ICD-10-CM

## 2017-08-10 NOTE — Patient Instructions (Signed)
1. Encounter for gynecological examination with abnormal finding Gynecologic exam status post recent LAVH with bilateral salpingectomy.  Very good postop healing without any complication.  Vaginal vault well healed at 6 weeks now.  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  Breast exam normal.  Resume physical activity.  Will wait another 2 weeks before being sexually active.  2. Hx of total vaginal hysterectomy  Candace Stephens, it was a pleasure seeing you today!  I am very happy about how well you healed after surgery.  Health Maintenance, Female Adopting a healthy lifestyle and getting preventive care can go a long way to promote health and wellness. Talk with your health care provider about what schedule of regular examinations is right for you. This is a good chance for you to check in with your provider about disease prevention and staying healthy. In between checkups, there are plenty of things you can do on your own. Experts have done a lot of research about which lifestyle changes and preventive measures are most likely to keep you healthy. Ask your health care provider for more information. Weight and diet Eat a healthy diet  Be sure to include plenty of vegetables, fruits, low-fat dairy products, and lean protein.  Do not eat a lot of foods high in solid fats, added sugars, or salt.  Get regular exercise. This is one of the most important things you can do for your health. ? Most adults should exercise for at least 150 minutes each week. The exercise should increase your heart rate and make you sweat (moderate-intensity exercise). ? Most adults should also do strengthening exercises at least twice a week. This is in addition to the moderate-intensity exercise.  Maintain a healthy weight  Body mass index (BMI) is a measurement that can be used to identify possible weight problems. It estimates body fat based on height and weight. Your health care provider can help determine your BMI and help you  achieve or maintain a healthy weight.  For females 59 years of age and older: ? A BMI below 18.5 is considered underweight. ? A BMI of 18.5 to 24.9 is normal. ? A BMI of 25 to 29.9 is considered overweight. ? A BMI of 30 and above is considered obese.  Watch levels of cholesterol and blood lipids  You should start having your blood tested for lipids and cholesterol at 30 years of age, then have this test every 5 years.  You may need to have your cholesterol levels checked more often if: ? Your lipid or cholesterol levels are high. ? You are older than 30 years of age. ? You are at high risk for heart disease.  Cancer screening Lung Cancer  Lung cancer screening is recommended for adults 9-58 years old who are at high risk for lung cancer because of a history of smoking.  A yearly low-dose CT scan of the lungs is recommended for people who: ? Currently smoke. ? Have quit within the past 15 years. ? Have at least a 30-pack-year history of smoking. A pack year is smoking an average of one pack of cigarettes a day for 1 year.  Yearly screening should continue until it has been 15 years since you quit.  Yearly screening should stop if you develop a health problem that would prevent you from having lung cancer treatment.  Breast Cancer  Practice breast self-awareness. This means understanding how your breasts normally appear and feel.  It also means doing regular breast self-exams. Let your health care provider  know about any changes, no matter how small.  If you are in your 20s or 30s, you should have a clinical breast exam (CBE) by a health care provider every 1-3 years as part of a regular health exam.  If you are 40 or older, have a CBE every year. Also consider having a breast X-ray (mammogram) every year.  If you have a family history of breast cancer, talk to your health care provider about genetic screening.  If you are at high risk for breast cancer, talk to your health  care provider about having an MRI and a mammogram every year.  Breast cancer gene (BRCA) assessment is recommended for women who have family members with BRCA-related cancers. BRCA-related cancers include: ? Breast. ? Ovarian. ? Tubal. ? Peritoneal cancers.  Results of the assessment will determine the need for genetic counseling and BRCA1 and BRCA2 testing.  Cervical Cancer Your health care provider may recommend that you be screened regularly for cancer of the pelvic organs (ovaries, uterus, and vagina). This screening involves a pelvic examination, including checking for microscopic changes to the surface of your cervix (Pap test). You may be encouraged to have this screening done every 3 years, beginning at age 21.  For women ages 30-65, health care providers may recommend pelvic exams and Pap testing every 3 years, or they may recommend the Pap and pelvic exam, combined with testing for human papilloma virus (HPV), every 5 years. Some types of HPV increase your risk of cervical cancer. Testing for HPV may also be done on women of any age with unclear Pap test results.  Other health care providers may not recommend any screening for nonpregnant women who are considered low risk for pelvic cancer and who do not have symptoms. Ask your health care provider if a screening pelvic exam is right for you.  If you have had past treatment for cervical cancer or a condition that could lead to cancer, you need Pap tests and screening for cancer for at least 20 years after your treatment. If Pap tests have been discontinued, your risk factors (such as having a new sexual partner) need to be reassessed to determine if screening should resume. Some women have medical problems that increase the chance of getting cervical cancer. In these cases, your health care provider may recommend more frequent screening and Pap tests.  Colorectal Cancer  This type of cancer can be detected and often  prevented.  Routine colorectal cancer screening usually begins at 30 years of age and continues through 30 years of age.  Your health care provider may recommend screening at an earlier age if you have risk factors for colon cancer.  Your health care provider may also recommend using home test kits to check for hidden blood in the stool.  A small camera at the end of a tube can be used to examine your colon directly (sigmoidoscopy or colonoscopy). This is done to check for the earliest forms of colorectal cancer.  Routine screening usually begins at age 50.  Direct examination of the colon should be repeated every 5-10 years through 30 years of age. However, you may need to be screened more often if early forms of precancerous polyps or small growths are found.  Skin Cancer  Check your skin from head to toe regularly.  Tell your health care provider about any new moles or changes in moles, especially if there is a change in a mole's shape or color.  Also tell your health   care provider if you have a mole that is larger than the size of a pencil eraser.  Always use sunscreen. Apply sunscreen liberally and repeatedly throughout the day.  Protect yourself by wearing long sleeves, pants, a wide-brimmed hat, and sunglasses whenever you are outside.  Heart disease, diabetes, and high blood pressure  High blood pressure causes heart disease and increases the risk of stroke. High blood pressure is more likely to develop in: ? People who have blood pressure in the high end of the normal range (130-139/85-89 mm Hg). ? People who are overweight or obese. ? People who are African American.  If you are 18-39 years of age, have your blood pressure checked every 3-5 years. If you are 40 years of age or older, have your blood pressure checked every year. You should have your blood pressure measured twice-once when you are at a hospital or clinic, and once when you are not at a hospital or clinic.  Record the average of the two measurements. To check your blood pressure when you are not at a hospital or clinic, you can use: ? An automated blood pressure machine at a pharmacy. ? A home blood pressure monitor.  If you are between 55 years and 79 years old, ask your health care provider if you should take aspirin to prevent strokes.  Have regular diabetes screenings. This involves taking a blood sample to check your fasting blood sugar level. ? If you are at a normal weight and have a low risk for diabetes, have this test once every three years after 30 years of age. ? If you are overweight and have a high risk for diabetes, consider being tested at a younger age or more often. Preventing infection Hepatitis B  If you have a higher risk for hepatitis B, you should be screened for this virus. You are considered at high risk for hepatitis B if: ? You were born in a country where hepatitis B is common. Ask your health care provider which countries are considered high risk. ? Your parents were born in a high-risk country, and you have not been immunized against hepatitis B (hepatitis B vaccine). ? You have HIV or AIDS. ? You use needles to inject street drugs. ? You live with someone who has hepatitis B. ? You have had sex with someone who has hepatitis B. ? You get hemodialysis treatment. ? You take certain medicines for conditions, including cancer, organ transplantation, and autoimmune conditions.  Hepatitis C  Blood testing is recommended for: ? Everyone born from 1945 through 1965. ? Anyone with known risk factors for hepatitis C.  Sexually transmitted infections (STIs)  You should be screened for sexually transmitted infections (STIs) including gonorrhea and chlamydia if: ? You are sexually active and are younger than 30 years of age. ? You are older than 30 years of age and your health care provider tells you that you are at risk for this type of infection. ? Your sexual  activity has changed since you were last screened and you are at an increased risk for chlamydia or gonorrhea. Ask your health care provider if you are at risk.  If you do not have HIV, but are at risk, it may be recommended that you take a prescription medicine daily to prevent HIV infection. This is called pre-exposure prophylaxis (PrEP). You are considered at risk if: ? You are sexually active and do not regularly use condoms or know the HIV status of your partner(s). ? You   take drugs by injection. ? You are sexually active with a partner who has HIV.  Talk with your health care provider about whether you are at high risk of being infected with HIV. If you choose to begin PrEP, you should first be tested for HIV. You should then be tested every 3 months for as long as you are taking PrEP. Pregnancy  If you are premenopausal and you may become pregnant, ask your health care provider about preconception counseling.  If you may become pregnant, take 400 to 800 micrograms (mcg) of folic acid every day.  If you want to prevent pregnancy, talk to your health care provider about birth control (contraception). Osteoporosis and menopause  Osteoporosis is a disease in which the bones lose minerals and strength with aging. This can result in serious bone fractures. Your risk for osteoporosis can be identified using a bone density scan.  If you are 65 years of age or older, or if you are at risk for osteoporosis and fractures, ask your health care provider if you should be screened.  Ask your health care provider whether you should take a calcium or vitamin D supplement to lower your risk for osteoporosis.  Menopause may have certain physical symptoms and risks.  Hormone replacement therapy may reduce some of these symptoms and risks. Talk to your health care provider about whether hormone replacement therapy is right for you. Follow these instructions at home:  Schedule regular health, dental,  and eye exams.  Stay current with your immunizations.  Do not use any tobacco products including cigarettes, chewing tobacco, or electronic cigarettes.  If you are pregnant, do not drink alcohol.  If you are breastfeeding, limit how much and how often you drink alcohol.  Limit alcohol intake to no more than 1 drink per day for nonpregnant women. One drink equals 12 ounces of beer, 5 ounces of wine, or 1 ounces of hard liquor.  Do not use street drugs.  Do not share needles.  Ask your health care provider for help if you need support or information about quitting drugs.  Tell your health care provider if you often feel depressed.  Tell your health care provider if you have ever been abused or do not feel safe at home. This information is not intended to replace advice given to you by your health care provider. Make sure you discuss any questions you have with your health care provider. Document Released: 01/12/2011 Document Revised: 12/05/2015 Document Reviewed: 04/02/2015 Elsevier Interactive Patient Education  2018 [employer] Inc.  

## 2017-08-10 NOTE — Progress Notes (Signed)
Delanna Noticeamara N Guyton 16-Dec-1985 [id number]   History:    32 y.o. 281P1L1 Married  RP:  Established patient presenting for annual gyn exam   HPI: LAVH/Bilateral Salpingectomy 06/29/2017.  No complication.  Patho benign.  No abdominopelvic pain.  No vaginal bleeding.  No abnormal vaginal discharge.  Urine/BMs wnl.  Breasts wnl.  Past medical history,surgical history, family history and social history were all reviewed and documented in the EPIC chart.  Gynecologic History Patient's last menstrual period was 06/14/2017. Contraception: status post hysterectomy Last Pap: Pathology on Cervix 06/29/2017 Normal, no dysplasia.  Last mammogram: Never Bone Density: N/A Colonoscopy: N/A  Obstetric History OB History  Gravida Para Term Preterm AB Living  1 1       1   SAB TAB Ectopic Multiple Live Births               # Outcome Date GA Lbr Len/2nd Weight Sex Delivery Anes PTL Lv  1 Para                ROS: A ROS was performed and pertinent positives and negatives are included in the history.  GENERAL: No fevers or chills. HEENT: No change in vision, no earache, sore throat or sinus congestion. NECK: No pain or stiffness. CARDIOVASCULAR: No chest pain or pressure. No palpitations. PULMONARY: No shortness of breath, cough or wheeze. GASTROINTESTINAL: No abdominal pain, nausea, vomiting or diarrhea, melena or bright red blood per rectum. GENITOURINARY: No urinary frequency, urgency, hesitancy or dysuria. MUSCULOSKELETAL: No joint or muscle pain, no back pain, no recent trauma. DERMATOLOGIC: No rash, no itching, no lesions. ENDOCRINE: No polyuria, polydipsia, no heat or cold intolerance. No recent change in weight. HEMATOLOGICAL: No anemia or easy bruising or bleeding. NEUROLOGIC: No headache, seizures, numbness, tingling or weakness. PSYCHIATRIC: No depression, no loss of interest in normal activity or change in sleep pattern.     Exam:   BP 122/78   Ht 5\' 3"  (1.6 m)   Wt 132 lb (59.9 kg)   LMP  06/14/2017   BMI 23.38 kg/m   Body mass index is 23.38 kg/m.  General appearance : Well developed well nourished female. No acute distress HEENT: Eyes: no retinal hemorrhage or exudates,  Neck supple, trachea midline, no carotid bruits, no thyroidmegaly Lungs: Clear to auscultation, no rhonchi or wheezes, or rib retractions  Heart: Regular rate and rhythm, no murmurs or gallops Breast:Examined in sitting and supine position were symmetrical in appearance, no palpable masses or tenderness,  no skin retraction, no nipple inversion, no nipple discharge, no skin discoloration, no axillary or supraclavicular lymphadenopathy Abdomen: no palpable masses or tenderness, no rebound or guarding Extremities: no edema or skin discoloration or tenderness  Pelvic: Vulva: Normal             Vagina: No gross lesions or discharge.  Healing very well.  No dehiscence.  No bleeding.  No sign of infection.  Cervix/Uterus absent  Adnexa  Without masses or tenderness  Anus: Normal   Assessment/Plan:  32 y.o. female for annual exam   1. Encounter for gynecological examination with abnormal finding Gynecologic exam status post recent LAVH with bilateral salpingectomy.  Very good postop healing without any complication.  Vaginal vault well healed at 6 weeks now.  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  Breast exam normal.  Resume physical activity.  Will wait another 2 weeks before being sexually active.  2. Hx of total vaginal hysterectomy   Genia DelMarie-Lyne Lashya Passe MD, 11:54 AM 08/10/2017

## 2017-08-18 ENCOUNTER — Encounter: Payer: BC Managed Care – PPO | Admitting: Obstetrics & Gynecology

## 2017-08-25 ENCOUNTER — Encounter: Payer: Self-pay | Admitting: Neurology

## 2017-10-20 ENCOUNTER — Encounter: Payer: Self-pay | Admitting: Neurology

## 2017-11-09 ENCOUNTER — Encounter: Payer: Self-pay | Admitting: Neurology

## 2017-11-19 ENCOUNTER — Ambulatory Visit: Payer: BC Managed Care – PPO | Admitting: Neurology

## 2017-11-19 ENCOUNTER — Encounter

## 2017-11-23 ENCOUNTER — Telehealth: Payer: Self-pay | Admitting: *Deleted

## 2017-11-23 NOTE — Telephone Encounter (Signed)
Patient called c/o vaginal spotting post LAVH in 2018 started today, not heavy, transferred to appointment desk to schedule visit with provider.

## 2017-11-24 ENCOUNTER — Other Ambulatory Visit: Payer: Self-pay | Admitting: Women's Health

## 2017-11-24 ENCOUNTER — Ambulatory Visit: Payer: BC Managed Care – PPO | Admitting: Women's Health

## 2017-11-24 ENCOUNTER — Encounter: Payer: Self-pay | Admitting: Women's Health

## 2017-11-24 VITALS — BP 132/80

## 2017-11-24 DIAGNOSIS — N898 Other specified noninflammatory disorders of vagina: Secondary | ICD-10-CM

## 2017-11-24 DIAGNOSIS — R319 Hematuria, unspecified: Secondary | ICD-10-CM

## 2017-11-24 DIAGNOSIS — N3000 Acute cystitis without hematuria: Secondary | ICD-10-CM

## 2017-11-24 LAB — WET PREP FOR TRICH, YEAST, CLUE

## 2017-11-24 MED ORDER — SULFAMETHOXAZOLE-TRIMETHOPRIM 800-160 MG PO TABS: 1.0000 | ORAL_TABLET | Freq: Two times a day (BID) | ORAL | 0 refills | Status: DC

## 2017-11-24 MED ORDER — FLUCONAZOLE 150 MG PO TABS: 150.0000 mg | ORAL_TABLET | Freq: Once | ORAL | 1 refills | Status: AC

## 2017-11-24 NOTE — Patient Instructions (Addendum)
Vaginal Yeast infection, Adult Vaginal yeast infection is a condition that causes soreness, swelling, and redness (inflammation) of the vagina. It also causes vaginal discharge. This is a common condition. Some women get this infection frequently. What are the causes? This condition is caused by a change in the normal balance of the yeast (candida) and bacteria that live in the vagina. This change causes an overgrowth of yeast, which causes the inflammation. What increases the risk? This condition is more likely to develop in:  Women who take antibiotic medicines.  Women who have diabetes.  Women who take birth control pills.  Women who are pregnant.  Women who douche often.  Women who have a weak defense (immune) system.  Women who have been taking steroid medicines for a long time.  Women who frequently wear tight clothing.  What are the signs or symptoms? Symptoms of this condition include:  White, thick vaginal discharge.  Swelling, itching, redness, and irritation of the vagina. The lips of the vagina (vulva) may be affected as well.  Pain or a burning feeling while urinating.  Pain during sex.  How is this diagnosed? This condition is diagnosed with a medical history and physical exam. This will include a pelvic exam. Your health care provider will examine a sample of your vaginal discharge under a microscope. Your health care provider may send this sample for testing to confirm the diagnosis. How is this treated? This condition is treated with medicine. Medicines may be over-the-counter or prescription. You may be told to use one or more of the following:  Medicine that is taken orally.  Medicine that is applied as a cream.  Medicine that is inserted directly into the vagina (suppository).  Follow these instructions at home:  Take or apply over-the-counter and prescription medicines only as told by your health care provider.  Do not have sex until your health  care provider has approved. Tell your sex partner that you have a yeast infection. That person should go to his or her health care provider if he or she develops symptoms.  Do not wear tight clothes, such as pantyhose or tight pants.  Avoid using tampons until your health care provider approves.  Eat more yogurt. This may help to keep your yeast infection from returning.  Try taking a sitz bath to help with discomfort. This is a warm water bath that is taken while you are sitting down. The water should only come up to your hips and should cover your buttocks. Do this 3-4 times per day or as told by your health care provider.  Do not douche.  Wear breathable, cotton underwear.  If you have diabetes, keep your blood sugar levels under control. Contact a health care provider if:  You have a fever.  Your symptoms go away and then return.  Your symptoms do not get better with treatment.  Your symptoms get worse.  You have new symptoms.  You develop blisters in or around your vagina.  You have blood coming from your vagina and it is not your menstrual period.  You develop pain in your abdomen. This information is not intended to replace advice given to you by your health care provider. Make sure you discuss any questions you have with your health care provider. Document Released: 04/08/2005 Document Revised: 12/11/2015 Document Reviewed: 12/31/2014 Elsevier Interactive Patient Education  2018 [employer] Inc[employer]  Urinary Tract Infection, Adult A urinary tract infection (UTI) is an infection of any part of the urinary tract. The   urinary tract includes the:  Kidneys.  Ureters.  Bladder.  Urethra.  These organs make, store, and get rid of pee (urine) in the body. Follow these instructions at home:  Take over-the-counter and prescription medicines only as told by your doctor.  If you were prescribed an antibiotic medicine, take it as told by your doctor. Do not stop taking the  antibiotic even if you start to feel better.  Avoid the following drinks: ? Alcohol. ? Caffeine. ? Tea. ? Carbonated drinks.  Drink enough fluid to keep your pee clear or pale yellow.  Keep all follow-up visits as told by your doctor. This is important.  Make sure to: ? Empty your bladder often and completely. Do not to hold pee for long periods of time. ? Empty your bladder before and after sex. ? Wipe from front to back after a bowel movement if you are female. Use each tissue one time when you wipe. Contact a doctor if:  You have back pain.  You have a fever.  You feel sick to your stomach (nauseous).  You throw up (vomit).  Your symptoms do not get better after 3 days.  Your symptoms go away and then come back. Get help right away if:  You have very bad back pain.  You have very bad lower belly (abdominal) pain.  You are throwing up and cannot keep down any medicines or water. This information is not intended to replace advice given to you by your health care provider. Make sure you discuss any questions you have with your health care provider. Document Released: 12/16/2007 Document Revised: 12/05/2015 Document Reviewed: 05/20/2015 Elsevier Interactive Patient Education  2018 [employer] Inc.  
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
## 2017-11-25 LAB — URINE CULTURE
MICRO NUMBER:: 90593629
RESULT: NO GROWTH
SPECIMEN QUALITY:: ADEQUATE

## 2017-12-01 LAB — URINALYSIS, COMPLETE W/RFL CULTURE
BILIRUBIN URINE: NEGATIVE
Glucose, UA: NEGATIVE
Hyaline Cast: NONE SEEN /LPF
Ketones, ur: NEGATIVE
NITRITES URINE, INITIAL: NEGATIVE
Protein, ur: NEGATIVE
SPECIFIC GRAVITY, URINE: 1.025 (ref 1.001–1.03)
pH: 5.5 (ref 5.0–8.0)

## 2017-12-01 LAB — URINE CULTURE

## 2017-12-01 LAB — CULTURE INDICATED

## 2018-01-28 ENCOUNTER — Ambulatory Visit: Payer: BC Managed Care – PPO | Admitting: Neurology

## 2018-01-28 ENCOUNTER — Encounter: Payer: Self-pay | Admitting: Neurology

## 2018-01-28 VITALS — BP 110/80 | HR 96 | Ht 63.0 in | Wt 142.1 lb

## 2018-01-28 DIAGNOSIS — G43009 Migraine without aura, not intractable, without status migrainosus: Secondary | ICD-10-CM | POA: Diagnosis not present

## 2018-01-28 DIAGNOSIS — G40909 Epilepsy, unspecified, not intractable, without status epilepticus: Secondary | ICD-10-CM

## 2018-01-28 DIAGNOSIS — G238 Other specified degenerative diseases of basal ganglia: Secondary | ICD-10-CM | POA: Diagnosis not present
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
## 2018-01-28 NOTE — Patient Instructions (Signed)
Continue your medications as you are taking them  Return to clinic in 6 months 

## 2018-03-16 ENCOUNTER — Encounter: Payer: Self-pay | Admitting: Neurology
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
## 2018-07-01 ENCOUNTER — Other Ambulatory Visit: Payer: Self-pay | Admitting: Neurology
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
## 2018-08-01 ENCOUNTER — Ambulatory Visit: Payer: BC Managed Care – PPO | Admitting: Neurology

## 2018-08-01 ENCOUNTER — Encounter: Payer: Self-pay | Admitting: Neurology

## 2018-08-01 VITALS — BP 110/80 | HR 108 | Ht 63.0 in | Wt 142.2 lb

## 2018-08-01 DIAGNOSIS — G43009 Migraine without aura, not intractable, without status migrainosus: Secondary | ICD-10-CM

## 2018-08-01 DIAGNOSIS — M5441 Lumbago with sciatica, right side: Secondary | ICD-10-CM

## 2018-08-01 DIAGNOSIS — G40909 Epilepsy, unspecified, not intractable, without status epilepticus: Secondary | ICD-10-CM

## 2018-08-01 DIAGNOSIS — G238 Other specified degenerative diseases of basal ganglia: Secondary | ICD-10-CM | POA: Diagnosis not present

## 2018-08-01 MED ORDER — CYCLOBENZAPRINE HCL 5 MG PO TABS: 5.0000 mg | ORAL_TABLET | Freq: Every evening | ORAL | 3 refills | Status: DC | PRN

## 2018-08-01 NOTE — Patient Instructions (Signed)
Start physical therapy for right leg pain/sciatica Start cyclobenzaprine 5mg  at bedtime  If no improvement, call my office in 2 weeks and we can offer course of steroids  Return to clinic in 9 months

## 2018-08-04 ENCOUNTER — Telehealth: Payer: Self-pay | Admitting: *Deleted

## 2018-08-04 NOTE — Telephone Encounter (Signed)
Patient is scheduled for PT on 08-10-2018 at 3:30 pm with [facility].

## 2018-08-09 ENCOUNTER — Other Ambulatory Visit: Payer: Self-pay | Admitting: *Deleted

## 2018-08-09 MED ORDER — LEVETIRACETAM ER 500 MG PO TB24: ORAL_TABLET | ORAL | 11 refills | Status: DC

## 2018-08-11 ENCOUNTER — Encounter: Payer: Self-pay | Admitting: Obstetrics & Gynecology

## 2018-08-11 ENCOUNTER — Ambulatory Visit: Payer: BC Managed Care – PPO | Admitting: Obstetrics & Gynecology

## 2018-08-11 ENCOUNTER — Encounter: Payer: BC Managed Care – PPO | Admitting: Gynecology

## 2018-08-11 VITALS — BP 124/78 | Ht 63.0 in | Wt 144.0 lb

## 2018-08-11 DIAGNOSIS — Z90710 Acquired absence of both cervix and uterus: Secondary | ICD-10-CM | POA: Diagnosis not present

## 2018-08-11 DIAGNOSIS — Z01419 Encounter for gynecological examination (general) (routine) without abnormal findings: Secondary | ICD-10-CM | POA: Diagnosis not present

## 2018-08-11 NOTE — Patient Instructions (Signed)
1. Well female exam with routine gynecological exam Gynecologic exam status post total hysterectomy.  Pathology on cervix benign June 29, 2017.  Will do Pap test every 5 years.  Breast exam normal.  Body mass index 25.51.  Healthy nutrition.  Under PT treatment for right sciatic nerve pain.  2. S/P total hysterectomy  Candace Stephens, it was a pleasure seeing you today!

## 2018-08-11 NOTE — Progress Notes (Signed)
Candace Stephens 07-31-1985 [id number]   History:    33 y.o. G1P1L1 Stable boyfriend x 19 yrs.  Son is 66 yo.  RP:  Established patient presenting for annual gyn exam   HPI: Status post LAVH/bilateral salpingectomy on June 29, 2017.  Doing very well since then.  No abdominopelvic pain.  No pain with intercourse.  Normal vaginal secretions.  Breast normal.  Body mass index 25.51.  Doing physical therapy for right sciatic nerve pain.  Healthy nutrition.  Past medical history,surgical history, family history and social history were all reviewed and documented in the EPIC chart.  Gynecologic History Patient's last menstrual period was 06/14/2017. Contraception: status post hysterectomy Last Pap: Pathology on cervix benign June 29, 2017. Last mammogram: Never Bone Density: Never Colonoscopy: Never  Obstetric History OB History  Gravida Para Term Preterm AB Living  1 1       1   SAB TAB Ectopic Multiple Live Births               # Outcome Date GA Lbr Len/2nd Weight Sex Delivery Anes PTL Lv  1 Para              ROS: A ROS was performed and pertinent positives and negatives are included in the history.  GENERAL: No fevers or chills. HEENT: No change in vision, no earache, sore throat or sinus congestion. NECK: No pain or stiffness. CARDIOVASCULAR: No chest pain or pressure. No palpitations. PULMONARY: No shortness of breath, cough or wheeze. GASTROINTESTINAL: No abdominal pain, nausea, vomiting or diarrhea, melena or bright red blood per rectum. GENITOURINARY: No urinary frequency, urgency, hesitancy or dysuria. MUSCULOSKELETAL: No joint or muscle pain, no back pain, no recent trauma. DERMATOLOGIC: No rash, no itching, no lesions. ENDOCRINE: No polyuria, polydipsia, no heat or cold intolerance. No recent change in weight. HEMATOLOGICAL: No anemia or easy bruising or bleeding. NEUROLOGIC: No headache, seizures, numbness, tingling or weakness. PSYCHIATRIC: No depression, no loss of  interest in normal activity or change in sleep pattern.     Exam:   BP 124/78   Ht 5\' 3"  (1.6 m)   Wt 144 lb (65.3 kg)   LMP 06/14/2017   BMI 25.51 kg/m   Body mass index is 25.51 kg/m.  General appearance : Well developed well nourished female. No acute distress HEENT: Eyes: no retinal hemorrhage or exudates,  Neck supple, trachea midline, no carotid bruits, no thyroidmegaly Lungs: Clear to auscultation, no rhonchi or wheezes, or rib retractions  Heart: Regular rate and rhythm, no murmurs or gallops Breast:Examined in sitting and supine position were symmetrical in appearance, no palpable masses or tenderness,  no skin retraction, no nipple inversion, no nipple discharge, no skin discoloration, no axillary or supraclavicular lymphadenopathy Abdomen: no palpable masses or tenderness, no rebound or guarding Extremities: no edema or skin discoloration or tenderness  Pelvic: Vulva: Normal             Vagina: No gross lesions or discharge  Cervix/Uterus absent  Adnexa  Without masses or tenderness  Anus: Normal   Assessment/Plan:  33 y.o. female for annual exam   1. Well female exam with routine gynecological exam Gynecologic exam status post total hysterectomy.  Pathology on cervix benign June 29, 2017.  Will do Pap test every 5 years.  Breast exam normal.  Body mass index 25.51.  Healthy nutrition.  Under PT treatment for right sciatic nerve pain.  2. S/P total hysterectomy  Candace Del MD, 2:41 PM 08/11/2018

## 2018-10-28 NOTE — Telephone Encounter (Signed)
Patient will be schedule for e visit.

## 2018-11-02 ENCOUNTER — Encounter: Payer: Self-pay | Admitting: *Deleted

## 2018-11-03 ENCOUNTER — Encounter: Payer: Self-pay | Admitting: *Deleted

## 2018-11-03 ENCOUNTER — Telehealth (INDEPENDENT_AMBULATORY_CARE_PROVIDER_SITE_OTHER): Payer: BC Managed Care – PPO | Admitting: Neurology

## 2018-11-03 ENCOUNTER — Encounter: Payer: Self-pay | Admitting: Neurology

## 2018-11-03 ENCOUNTER — Other Ambulatory Visit: Payer: Self-pay

## 2018-11-03 VITALS — Ht 63.0 in | Wt 144.0 lb

## 2018-11-03 DIAGNOSIS — G40909 Epilepsy, unspecified, not intractable, without status epilepticus: Secondary | ICD-10-CM

## 2018-11-03 DIAGNOSIS — G238 Other specified degenerative diseases of basal ganglia: Secondary | ICD-10-CM

## 2018-11-03 DIAGNOSIS — G43009 Migraine without aura, not intractable, without status migrainosus: Secondary | ICD-10-CM

## 2018-11-03 MED ORDER — PREDNISONE 10 MG (21) PO TBPK: ORAL_TABLET | ORAL | 0 refills | Status: DC

## 2018-11-03 NOTE — Progress Notes (Signed)
   Virtual Visit via Video Note The purpose of this virtual visit is to provide medical care while limiting exposure to the novel coronavirus.    Consent was obtained for video visit:  Yes.   Answered questions that patient had about telehealth interaction:  Yes.   I discussed the limitations, risks, security and privacy concerns of performing an evaluation and management service by telemedicine. I also discussed with the patient that there may be a patient responsible charge related to this service. The patient expressed understanding and agreed to proceed.  Pt location: Home Physician Location: office Name of referring provider:  Piedad Climes, [place], * I connected with Delanna Notice at patients initiation/request on 11/03/2018 at  9:00 AM EDT by video enabled telemedicine application and verified that I am speaking with the correct person using two identifiers. Pt MRN:  [id number] Pt DOB:  1986/01/03 Video Participants:  Delanna Notice   History of Present Illness: This is a 33 y.o. female returning for follow-up of worsening migraines.  Headaches were well-controlled until last week on nortriptyline 50mg  at bedtime.  She developed worsening headaches, occurring daily.  Pain is over the right retroorbital region that radiates in to the base of the neck, sometimes like a band-like sensation and squeezing. She had tried ibuprofen and flexeril with no relief.  Headaches are worse as the day progresses and improved with rest. Seizures are well-controlled on Keppra 1000mg  at bedtime.  Her leg pain resolved with physical therapy.    Observations/Objective:   Vitals:   11/03/18 0848  Weight: 144 lb (65.3 kg)  Height: 5\' 3"  (1.6 m)   Patient is awake, alert, and appears comfortable.  Oriented x 4.   Extraocular muscles are intact. No ptosis.  Face is symmetric.  Speech is not dysarthric. Tongue is midline. Antigravity in all extremities.  No pronator drift.   Assessment and Plan:  1.   Chronic migraines, worsening.    - Start prednisone taper pack over one week to break headache cycle   - Continue nortriptyline 50mg  at bedtime   2. Generalized seizure disorder in the setting of Fahr disease, stable  - Continue Keppra XL 1000mg  at bedtime  Follow Up Instructions:   I discussed the assessment and treatment plan with the patient. The patient was provided an opportunity to ask questions and all were answered. The patient agreed with the plan and demonstrated an understanding of the instructions.   The patient was advised to call back or seek an in-person evaluation if the symptoms worsen or if the condition fails to improve as anticipated.  Follow-up in 6 months   Glendale Chard, DO

## 2018-11-09 ENCOUNTER — Other Ambulatory Visit: Payer: Self-pay | Admitting: *Deleted

## 2018-11-09 DIAGNOSIS — R4020 Unspecified coma: Secondary | ICD-10-CM

## 2018-11-11 ENCOUNTER — Other Ambulatory Visit: Payer: Self-pay | Admitting: *Deleted

## 2018-11-11 MED ORDER — NORTRIPTYLINE HCL 25 MG PO CAPS: 25.0000 mg | ORAL_CAPSULE | Freq: Every day | ORAL | 3 refills | Status: DC

## 2018-11-14 ENCOUNTER — Telehealth: Payer: Self-pay | Admitting: *Deleted

## 2018-11-14 NOTE — Telephone Encounter (Signed)
[employer] 30 day cardiac event monitor to be shipped to patients home.  Instructions reviewed briefly as they are included in monitor kit.

## 2018-11-16 ENCOUNTER — Encounter (INDEPENDENT_AMBULATORY_CARE_PROVIDER_SITE_OTHER): Payer: BC Managed Care – PPO

## 2018-11-16 DIAGNOSIS — R4020 Unspecified coma: Secondary | ICD-10-CM

## 2018-11-18 ENCOUNTER — Encounter: Payer: Self-pay | Admitting: *Deleted

## 2018-11-21 ENCOUNTER — Encounter: Payer: Self-pay | Admitting: *Deleted

## 2018-11-21 ENCOUNTER — Other Ambulatory Visit: Payer: Self-pay

## 2018-11-21 ENCOUNTER — Telehealth (INDEPENDENT_AMBULATORY_CARE_PROVIDER_SITE_OTHER): Payer: BC Managed Care – PPO | Admitting: Neurology

## 2018-11-21 ENCOUNTER — Encounter: Payer: Self-pay | Admitting: Neurology

## 2018-11-21 VITALS — Ht 63.0 in | Wt 144.0 lb

## 2018-11-21 DIAGNOSIS — G43009 Migraine without aura, not intractable, without status migrainosus: Secondary | ICD-10-CM

## 2018-11-21 DIAGNOSIS — G40909 Epilepsy, unspecified, not intractable, without status epilepticus: Secondary | ICD-10-CM

## 2018-11-21 DIAGNOSIS — R002 Palpitations: Secondary | ICD-10-CM | POA: Diagnosis not present

## 2018-11-21 NOTE — Progress Notes (Signed)
   Virtual Visit via Video Note The purpose of this virtual visit is to provide medical care while limiting exposure to the novel coronavirus.    Consent was obtained for video visit:  Yes.   Answered questions that patient had about telehealth interaction:  Yes.   I discussed the limitations, risks, security and privacy concerns of performing an evaluation and management service by telemedicine. I also discussed with the patient that there may be a patient responsible charge related to this service. The patient expressed understanding and agreed to proceed.  Pt location: Home Physician Location: office Name of referring provider:  Piedad Climes, [place], * I connected with Delanna Notice at patients initiation/request on 11/21/2018 at  9:00 AM EDT by video enabled telemedicine application and verified that I am speaking with the correct person using two identifiers. Pt MRN:  [id number] Pt DOB:  1985/11/14 Video Participants:  Delanna Notice   History of Present Illness: This is a 33 y.o. female returning for follow-up of migraines.  In late April, she contacted my office due to worsening headaches and was given a course of prednisone and her nortriptyline was increased to 75mg  at bedtime.  She reports significantly improved headaches and is feeling the best she has in several weeks.  Her last headache was early last week and reduced in intensity.  She does continue to feel that her heart is racing and is wearing a cardiac monitor.  She has not had any interval seizures or falls.  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   Observations/Objective:   Vitals:   11/21/18 0846  Weight: 144 lb (65.3 kg)  Height: 5\' 3"  (1.6 m)  BP 138/87  HR 101 Patient is awake, alert, and appears comfortable.  Oriented x 4.   Extraocular muscles are intact. No ptosis.  Face is symmetric.  Speech is  not dysarthric.  Antigravity in all extremities.   Assessment and Plan:  1.  Chronic migraines, improved  -Continue nortriptyline 75mg  at bedtime  2.  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  Episodic palpitations and fatigue  -She is being followed on 30-d cardiac monitoring, I will follow-up with these results once they are finalized  Follow Up Instructions:   I discussed the assessment and treatment plan with the patient. The patient was provided an opportunity to ask questions and all were answered. The patient agreed with the plan and demonstrated an understanding of the instructions.   The patient was advised to call back or seek an in-person evaluation if the symptoms worsen or if the condition fails to improve as anticipated.  Follow-up in 5 months   Glendale Chard, DO

## 2018-11-26 ENCOUNTER — Encounter (HOSPITAL_COMMUNITY): Payer: Self-pay

## 2018-11-26 ENCOUNTER — Other Ambulatory Visit: Payer: Self-pay

## 2018-11-26 ENCOUNTER — Emergency Department (HOSPITAL_COMMUNITY)
Admission: EM | Admit: 2018-11-26 | Discharge: 2018-11-26 | Disposition: A | Discharge status: Home / Self Care | Payer: BC Managed Care – PPO | Attending: Emergency Medicine | Admitting: Emergency Medicine

## 2018-11-26 ENCOUNTER — Emergency Department (HOSPITAL_COMMUNITY): Payer: BC Managed Care – PPO

## 2018-11-26 DIAGNOSIS — Z79899 Other long term (current) drug therapy: Secondary | ICD-10-CM | POA: Diagnosis not present

## 2018-11-26 DIAGNOSIS — J45909 Unspecified asthma, uncomplicated: Secondary | ICD-10-CM | POA: Diagnosis not present

## 2018-11-26 DIAGNOSIS — R0781 Pleurodynia: Secondary | ICD-10-CM | POA: Insufficient documentation

## 2018-11-26 DIAGNOSIS — R079 Chest pain, unspecified: Secondary | ICD-10-CM | POA: Diagnosis present

## 2018-11-26 LAB — CBC
HCT: 37.1 % (ref 36.0–46.0)
Hemoglobin: 12.1 g/dL (ref 12.0–15.0)
MCH: 27.5 pg (ref 26.0–34.0)
MCHC: 32.6 g/dL (ref 30.0–36.0)
MCV: 84.3 fL (ref 80.0–100.0)
Platelets: 166 10*3/uL (ref 150–400)
RBC: 4.4 MIL/uL (ref 3.87–5.11)
RDW: 13.1 % (ref 11.5–15.5)
WBC: 4.3 10*3/uL (ref 4.0–10.5)
nRBC: 0 % (ref 0.0–0.2)

## 2018-11-26 LAB — COMPREHENSIVE METABOLIC PANEL
ALT: 30 U/L (ref 0–44)
AST: 20 U/L (ref 15–41)
Albumin: 4.4 g/dL (ref 3.5–5.0)
Alkaline Phosphatase: 42 U/L (ref 38–126)
Anion gap: 8 (ref 5–15)
BUN: 15 mg/dL (ref 6–20)
CO2: 22 mmol/L (ref 22–32)
Calcium: 9.2 mg/dL (ref 8.9–10.3)
Chloride: 107 mmol/L (ref 98–111)
Creatinine, Ser: 0.73 mg/dL (ref 0.44–1.00)
GFR calc Af Amer: >60 mL/min (ref >60)
GFR calc non Af Amer: >60 mL/min (ref >60)
Glucose, Bld: 93 mg/dL (ref 70–99)
Potassium: 3.7 mmol/L (ref 3.5–5.1)
Sodium: 137 mmol/L (ref 135–145)
Total Bilirubin: 0.6 mg/dL (ref 0.3–1.2)
Total Protein: 7.3 g/dL (ref 6.5–8.1)

## 2018-11-26 LAB — D-DIMER, QUANTITATIVE: D-Dimer, Quant: <0.27 ug/mL-FEU (ref 0.00–0.50)

## 2018-11-26 LAB — I-STAT BETA HCG BLOOD, ED (MC, WL, AP ONLY): I-stat hCG, quantitative: <5 m[IU]/mL (ref <5)

## 2018-11-26 LAB — TROPONIN I: Troponin I: <0.03 ng/mL (ref <0.03)

## 2018-11-26 MED ORDER — MECLIZINE HCL 25 MG PO TABS
25.0000 mg | ORAL_TABLET | Freq: Once | ORAL | Status: AC
  Administered 2018-11-26: 25 mg via ORAL
  Filled 2018-11-26: qty 1

## 2018-11-26 MED ORDER — ONDANSETRON HCL 4 MG/2ML IJ SOLN
4.0000 mg | Freq: Once | INTRAMUSCULAR | Status: AC
  Administered 2018-11-26: 4 mg via INTRAVENOUS
  Filled 2018-11-26: qty 2

## 2018-11-26 MED ORDER — SODIUM CHLORIDE 0.9 % IV BOLUS
1000.0000 mL | Freq: Once | INTRAVENOUS | Status: AC
  Administered 2018-11-26: 1000 mL via INTRAVENOUS

## 2018-11-26 NOTE — ED Notes (Signed)
Bed: BB04 Expected date:  Expected time:  Means of arrival:  Comments: EMS - chest pain

## 2018-11-26 NOTE — ED Provider Notes (Signed)
[facility]-EMERGENCY DEPT Provider Note   CSN: [id number] Arrival date & time: 11/26/18  2005    History   Chief Complaint Chief Complaint  Patient presents with  . Chest Pain    HPI Candace Stephens is a 33 y.o. female.     HPI Patient is a 33 year old female presents the emergency department complaints of chest discomfort with associated lightheadedness and pleuritic nature to her chest pain since this afternoon.  Denies fevers and chills.  No productive cough.  No history of DVT or pulmonary embolism.  Denies unilateral leg swelling.  She states she was working in the yard and became slightly lightheaded and felt "dizzy".  No syncope.  She has had a history of tachycardia and has had syncope before in the past and is currently wearing a 30-day Holter monitor which she has on her anterior chest.  She has not been alerted by the monitoring company.  No syncope today.  Currently she feels nauseated and feels a sensation like the room is going around   Past Medical History:  Diagnosis Date  . Asthma    with colds only no inhaler in a year  . Headache    migraines  . History of blood clots    endometrium  . History of kidney stones   . Seizure disorder [facility])     Patient Active Problem List   Diagnosis Date Noted  . Post-operative state 06/29/2017  . 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 Seizure disorder (HCC) 07/02/2016  . 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 APPENDECTOMY    . CESAREAN SECTION    . LAPAROSCOPIC VAGINAL HYSTERECTOMY WITH SALPINGECTOMY Bilateral 06/29/2017   Procedure: LAPAROSCOPIC ASSISTED VAGINAL HYSTERECTOMY WITH SALPINGECTOMY;  Surgeon: Genia Del, MD;  Location: [facility];  Service: Gynecology;  Laterality: Bilateral;  request 7:30am OR time in [facility] block requests two hours OR time  . TONSILLECTOMY       OB History    Gravida  1   Para  1   Term      Preterm      AB      Living  1     SAB      TAB      Ectopic      Multiple      Live Births               Home Medications    Prior to Admission medications   Medication Sig Start Date End Date Taking? Authorizing Provider  cyclobenzaprine (FLEXERIL) 5 MG tablet Take 1 tablet (5 mg total) by mouth at bedtime as needed for muscle spasms (low back pain). 08/01/18   Nita Sickle K, DO  levETIRAcetam (KEPPRA XR) 500 MG 24 hr tablet TAKE 2 TABLETS(1000 MG) BY MOUTH AT BEDTIME 08/09/18   Patel, Donika K, DO  nortriptyline (PAMELOR) 25 MG capsule Take 1 capsule (25 mg total) by mouth at bedtime. 11/11/18   Patel, Donika K, DO  nortriptyline (PAMELOR) 50 MG capsule TAKE 1 CAPSULE(50 MG) BY MOUTH AT BEDTIME 07/01/18   Patel, Donika K, DO  predniSONE (STERAPRED UNI-PAK 21 TAB) 10 MG (21) TBPK tablet Take 6 tablet on day 1, then reduce by 1 tablet each day. 11/03/18   Glendale Chard, DO    Family History Family History  Problem Relation Age of Onset  . High blood pressure Mother   .  GER disease Mother   . Hypertension Mother   . Cervical cancer Maternal Grandmother   . Healthy Maternal Grandfather   . Cervical cancer Paternal Aunt     Social History Social History   Tobacco Use  . Smoking status: Never Smoker  . Smokeless tobacco: Never Used  Substance Use Topics  . Alcohol use: No  . Drug use: No     Allergies   Hydrocodone; Sumatriptan; Topiramate; and Hydrocodone-acetaminophen   Review of Systems Review of Systems  All other systems reviewed and are negative.    Physical Exam Updated Vital Signs BP (!) 140/98 (BP Location: Right Arm)   Pulse (!) 120   Temp 98.7 F (37.1 C) (Oral)   Resp 19   Ht  (1.6 m)   Wt 64.9 kg   LMP 06/14/2017   SpO2 100%   BMI 25.33 kg/m   Physical Exam Vitals signs and nursing note reviewed.  Constitutional:      General: She is not in acute distress.    Appearance: She is well-developed.   HENT:     Head: Normocephalic and atraumatic.  Neck:     Musculoskeletal: Normal range of motion.  Cardiovascular:     Rate and Rhythm: Regular rhythm. Tachycardia present.     Heart sounds: Normal heart sounds.  Pulmonary:     Effort: Pulmonary effort is normal.     Breath sounds: Normal breath sounds.  Abdominal:     General: There is no distension.     Palpations: Abdomen is soft.     Tenderness: There is no abdominal tenderness.  Musculoskeletal: Normal range of motion.  Skin:    General: Skin is warm and dry.  Neurological:     Mental Status: She is alert and oriented to person, place, and time.  Psychiatric:        Judgment: Judgment normal.      ED Treatments / Results  Labs (all labs ordered are listed, but only abnormal results are displayed) Labs Reviewed  CBC  COMPREHENSIVE METABOLIC PANEL  TROPONIN I  D-DIMER, QUANTITATIVE (NOT AT [facility])  I-STAT BETA HCG BLOOD, ED ([facility], [facility], [facility] ONLY)    EKG EKG Interpretation  Date/Time:  Saturday Nov 26 2018 20:26:07 EDT Ventricular Rate:  121 PR Interval:    QRS Duration: 105 QT Interval:  329 QTC Calculation: 467 R Axis:   79 Text Interpretation:  Sinus tachycardia RSR' in V1 or V2, right VCD or RVH Borderline T abnormalities, inferior leads No significant change was found except for rate Confirmed by Azalia Bilis ([id number]) on 11/26/2018 10:29:31 PM   Radiology Dg Chest 2 View  Result Date: 11/26/2018 CLINICAL DATA:  Chest pain. EXAM: CHEST - 2 VIEW COMPARISON:  01/27/2016 FINDINGS: The cardiomediastinal contours are normal. The lungs are clear. Pulmonary vasculature is normal. No consolidation, pleural effusion, or pneumothorax. No acute osseous abnormalities are seen. Monitoring device overlies the chest wall anteriorly. IMPRESSION: Unremarkable radiographs of the chest. Electronically Signed   By: Narda Rutherford M.D.   On: 11/26/2018 22:24    Procedures Procedures (including critical care time)  Medications  Ordered in ED Medications  sodium chloride 0.9 % bolus 1,000 mL (has no administration in time range)  ondansetron (ZOFRAN) injection 4 mg (has no administration in time range)  meclizine (ANTIVERT) tablet 25 mg (has no administration in time range)     Initial Impression / Assessment and Plan / ED Course  I have reviewed the triage vital signs and the  nursing notes.  Pertinent labs & imaging results that were available during my care of the patient were reviewed by me and considered in my medical decision making (see chart for details).        Patient feels much better at this time.  D-dimer negative.  EKG without ischemic changes.  Troponin negative.  Doubt ACS.  Doubt PE.  Labs without significant abnormality.  Feels better after IV fluids.  Continue work-up with her primary team regarding her tachycardia.  She continues to wear the Holter monitor.  Note occasion for additional work-up or hospitalization at this time.  Patient understands to return to the emergency department for new or worsening symptoms  Final Clinical Impressions(s) / ED Diagnoses   Final diagnoses:  None    ED Discharge Orders    None       Azalia Bilisampos, Garhett Bernhard, MD 11/26/18 2256

## 2018-11-26 NOTE — ED Triage Notes (Signed)
Per EMS, pt with c/o right sided pain. Stated that pt read on her medication bottle to avoid  prolonged sun exposure. States after reading warnings, pt c/o CP. Holter monitor in place and pt denies getting a call

## 2018-12-15 ENCOUNTER — Other Ambulatory Visit: Payer: Self-pay

## 2018-12-26 ENCOUNTER — Other Ambulatory Visit: Payer: Self-pay

## 2018-12-26 DIAGNOSIS — I10 Essential (primary) hypertension: Secondary | ICD-10-CM

## 2018-12-26 DIAGNOSIS — R009 Unspecified abnormalities of heart beat: Secondary | ICD-10-CM

## 2018-12-26 DIAGNOSIS — R0602 Shortness of breath: Secondary | ICD-10-CM

## 2018-12-26 DIAGNOSIS — R002 Palpitations: Secondary | ICD-10-CM

## 2018-12-28 ENCOUNTER — Telehealth: Payer: Self-pay | Admitting: Cardiovascular Disease

## 2018-12-28 NOTE — Telephone Encounter (Signed)
Mychart, smartphone, consent, pre reg complete 12/28/18 AF °

## 2018-12-29 ENCOUNTER — Telehealth: Payer: Self-pay

## 2018-12-29 ENCOUNTER — Telehealth (INDEPENDENT_AMBULATORY_CARE_PROVIDER_SITE_OTHER): Payer: BC Managed Care – PPO | Admitting: Cardiovascular Disease

## 2018-12-29 DIAGNOSIS — R002 Palpitations: Secondary | ICD-10-CM | POA: Diagnosis not present

## 2018-12-29 NOTE — Patient Instructions (Signed)

## 2018-12-29 NOTE — Progress Notes (Signed)
Virtual Visit via Telephone Note   This visit type was conducted due to national recommendations for restrictions regarding the COVID-19 Pandemic (e.g. social distancing) in an effort to limit this patient's exposure and mitigate transmission in our community.  Due to her co-morbid illnesses, this patient is at least at moderate risk for complications without adequate follow up.  This format is felt to be most appropriate for this patient at this time.  The patient did not have access to video technology/had technical difficulties with video requiring transitioning to audio format only (telephone).  All issues noted in this document were discussed and addressed.  No physical exam could be performed with this format.  Please refer to the patient's chart for her  consent to telehealth for [facility].   Date:  12/29/2018   ID:  Candace Stephens, DOB [birth year], MRN [id number]  Patient Location: Home Provider Location: Home  PCP:  Burnis MedinFulbright, [place] E, [place]  Cardiologist: Dr. Nanetta BattyJonathan Quanita Barona Electrophysiologist:  None   Evaluation Performed:  New Patient Evaluation  Chief Complaint: Palpitations  History of Present Illness:    Candace Stephens is a 33 y.o. single African-American female mother of 1 child who works as a Chief Strategy Officerpre-k teacher's assistant and was referred by Dr. Allena KatzPatel for evaluation of palpitations.  She has no cardiac risk factors.  There is no family history of heart disease.  She is never had a heart attack or stroke.  She was seen in the emergency room 11/26/2018 with pleuritic chest pain.  She drinks 1 cup of coffee a day.  Her palpitations began a year ago.  She feels her heart racing at times especially when she is in a car although she has anxiety about this.  A recent 30-day event monitor which resulted on 12/15/2018 and read by Dr. Elberta Fortisamnitz showed no arrhythmias with episodes of sinus tachycardia up to 130.  The patient does not have symptoms concerning for COVID-19 infection (fever,  chills, cough, or new shortness of breath).    Past Medical History:  Diagnosis Date  . Asthma    with colds only no inhaler in a year  . Headache    migraines  . History of blood clots    endometrium  . History of kidney stones   . Seizure disorder [facility](HCC)    Past Surgical History:  Procedure Laterality Date  . APPENDECTOMY    . CESAREAN SECTION    . LAPAROSCOPIC VAGINAL HYSTERECTOMY WITH SALPINGECTOMY Bilateral 06/29/2017   Procedure: LAPAROSCOPIC ASSISTED VAGINAL HYSTERECTOMY WITH SALPINGECTOMY;  Surgeon: Genia DelLavoie, Marie[place], MD;  Location: [facility];  Service: Gynecology;  Laterality: Bilateral;  request 7:30am OR time in [facility] block requests two hours OR time  . TONSILLECTOMY       Current Meds  Medication Sig  . cyclobenzaprine (FLEXERIL) 5 MG tablet Take 1 tablet (5 mg total) by mouth at bedtime as needed for muscle spasms (low back pain).  Marland Kitchen. levETIRAcetam (KEPPRA XR) 500 MG 24 hr tablet TAKE 2 TABLETS(1000 MG) BY MOUTH AT BEDTIME (Patient taking differently: Take 1,000 mg by mouth at bedtime. )  . nortriptyline (PAMELOR) 25 MG capsule Take 1 capsule (25 mg total) by mouth at bedtime.  . nortriptyline (PAMELOR) 50 MG capsule TAKE 1 CAPSULE(50 MG) BY MOUTH AT BEDTIME (Patient taking differently: Take 50 mg by mouth at bedtime. )  . predniSONE (STERAPRED UNI-PAK 21 TAB) 10 MG (21) TBPK tablet Take 6 tablet on day 1, then reduce by 1 tablet each day.  Allergies:   Hydrocodone, Sumatriptan, Topiramate, and Hydrocodone-acetaminophen   Social History   Tobacco Use  . Smoking status: Never Smoker  . Smokeless tobacco: Never Used  Substance Use Topics  . Alcohol use: No  . Drug use: No     Family Hx: The patient's family history includes Cervical cancer in her maternal grandmother and paternal aunt; GER disease in her mother; Healthy in her maternal grandfather; High blood pressure in her mother; Hypertension in her mother.  ROS:   Please see the history of  present illness.     All other systems reviewed and are negative.   Prior CV studies:   The following studies were reviewed today:  Cardiac event monitor 12/15/2018  Labs/Other Tests and Data Reviewed:    EKG:  An ECG dated 11/26/2018 was personally reviewed today and demonstrated:  Sinus tachycardia 120 with R SR prime in V1  Recent Labs: 11/26/2018: ALT 30; BUN 15; Creatinine, Ser 0.73; Hemoglobin 12.1; Platelets 166; Potassium 3.7; Sodium 137   Recent Lipid Panel No results found for: CHOL, TRIG, HDL, CHOLHDL, LDLCALC, LDLDIRECT  Wt Readings from Last 3 Encounters:  12/29/18 148 lb (67.1 kg)  11/26/18 143 lb (64.9 kg)  11/21/18 144 lb (65.3 kg)     Objective:    Vital Signs:  Ht 5\' 3"  (1.6 m)   Wt 148 lb (67.1 kg)   LMP 06/14/2017   BMI 26.22 kg/m    VITAL SIGNS:  reviewed a complete physical exam was not performed today since this was a virtual telemedicine phone visit  ASSESSMENT & PLAN:    1. Palpitations- history of palpitations over the last year with recent event monitor resulted on 12/15/2018 that revealed no arrhythmias.  She did have some episodes of sinus tachycardia up to 130.  She drinks 1 cup of coffee a day.  She does admit to having episodes of anxiety potentially contributing to her palpitations.  At this point, I do not feel compelled to begin her on any medications but will have her return to her PCP to explore how anxiety may be contributing.  COVID-19 Education: The signs and symptoms of COVID-19 were discussed with the patient and how to seek care for testing (follow up with PCP or arrange E-visit).  The importance of social distancing was discussed today.  Time:   Today, I have spent 6 minutes with the patient with telehealth technology discussing the above problems.     Medication Adjustments/Labs and Tests Ordered: Current medicines are reviewed at length with the patient today.  Concerns regarding medicines are outlined above.   Tests Ordered:  No orders of the defined types were placed in this encounter.   Medication Changes: No orders of the defined types were placed in this encounter.   Follow Up:  In Person in 6 month(s)  Signed, Quay Burow, MD  12/29/2018 9:41 AM    [place]

## 2018-12-29 NOTE — Telephone Encounter (Signed)
Patient and/or DPR-approved person aware of 6/18 AVS instructions and verbalized understanding.  AVS to [employer]

## 2019-01-18 DIAGNOSIS — F410 Panic disorder [episodic paroxysmal anxiety] without agoraphobia: Secondary | ICD-10-CM | POA: Insufficient documentation

## 2019-02-16 DIAGNOSIS — F410 Panic disorder [episodic paroxysmal anxiety] without agoraphobia: Secondary | ICD-10-CM | POA: Insufficient documentation

## 2019-03-06 ENCOUNTER — Other Ambulatory Visit: Payer: Self-pay | Admitting: Neurology

## 2019-04-03 ENCOUNTER — Encounter: Payer: Self-pay | Admitting: Gynecology

## 2019-05-03 ENCOUNTER — Other Ambulatory Visit: Payer: Self-pay

## 2019-05-03 ENCOUNTER — Encounter: Payer: Self-pay | Admitting: Neurology

## 2019-05-03 ENCOUNTER — Telehealth (INDEPENDENT_AMBULATORY_CARE_PROVIDER_SITE_OTHER): Payer: BC Managed Care – PPO | Admitting: Neurology

## 2019-05-03 VITALS — Ht 63.0 in | Wt 150.0 lb

## 2019-05-03 DIAGNOSIS — G40909 Epilepsy, unspecified, not intractable, without status epilepticus: Secondary | ICD-10-CM

## 2019-05-03 DIAGNOSIS — G43709 Chronic migraine without aura, not intractable, without status migrainosus: Secondary | ICD-10-CM

## 2019-05-03 DIAGNOSIS — H8110 Benign paroxysmal vertigo, unspecified ear: Secondary | ICD-10-CM | POA: Diagnosis not present

## 2019-05-03 DIAGNOSIS — IMO0002 Reserved for concepts with insufficient information to code with codable children: Secondary | ICD-10-CM

## 2019-05-03 NOTE — Progress Notes (Signed)
   Virtual Visit via Video Note The purpose of this virtual visit is to provide medical care while limiting exposure to the novel coronavirus.    Consent was obtained for video visit:  Yes.   Answered questions that patient had about telehealth interaction:  Yes.   I discussed the limitations, risks, security and privacy concerns of performing an evaluation and management service by telemedicine. I also discussed with the patient that there may be a patient responsible charge related to this service. The patient expressed understanding and agreed to proceed.  Pt location: Home Physician Location: office Name of referring provider:  Belva Bertin, [place], * I connected with Candace Stephens at patients initiation/request on 05/03/2019 at  1:30 PM EDT by video enabled telemedicine application and verified that I am speaking with the correct person using two identifiers. Pt MRN:  [id number] Pt DOB:  02/11/1986 Video Participants:  Candace Stephens   History of Present Illness: This is a 33 y.o. female returning for follow-up of migraines and new complaints of vertigo.  Her headaches are fairly well-controlled.  Since school started, she has been getting headaches 2-3 times per week, and typically does not treat them unless severe.  She has not had any interval seizures.   Over the past few weeks, she had new vertigo, described as room-spinning, lightheadedness.  It is triggered by turning her head or changing positions.  Her PCP advised her to take meclizine which helped, however, the past two weeks, it has returned.  She endorses mild nausea with this.   Observations/Objective:   Vitals:   05/03/19 1058  Weight: 150 lb (68 kg)  Height: 5\' 3"  (1.6 m)   Patient is awake, alert, and appears comfortable.  Oriented x 4.   Extraocular muscles are intact. No ptosis.  Face is symmetric.  Speech is not dysarthric.  Antigravity in all extremities.   Assessment and Plan:  1.  Chronic migraines, stable  -Continue nortriptyline 75mg  at bedtime  2.  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  BPPV  - Start meclizine 12.5mg  BID  - Consider vestibular therapy going forward   Follow Up Instructions:   I discussed the assessment and treatment plan with the patient. The patient was provided an opportunity to ask questions and all were answered. The patient agreed with the plan and demonstrated an understanding of the instructions.   The patient was advised to call back or seek an in-person evaluation if the symptoms worsen or if the condition fails to improve as anticipated.  Follow-up in 6 months  Total time:  15 min  Jaz Mallick Keith Rake, DO

## 2019-05-05 ENCOUNTER — Telehealth: Payer: BC Managed Care – PPO | Admitting: Neurology

## 2019-07-01 ENCOUNTER — Other Ambulatory Visit: Payer: Self-pay | Admitting: Neurology

## 2019-07-06 ENCOUNTER — Encounter: Payer: Self-pay | Admitting: Neurology

## 2019-07-10 ENCOUNTER — Other Ambulatory Visit: Payer: Self-pay

## 2019-07-10 ENCOUNTER — Telehealth (INDEPENDENT_AMBULATORY_CARE_PROVIDER_SITE_OTHER): Payer: BC Managed Care – PPO | Admitting: Neurology

## 2019-07-10 VITALS — Ht 63.0 in | Wt 146.0 lb

## 2019-07-10 DIAGNOSIS — G43709 Chronic migraine without aura, not intractable, without status migrainosus: Secondary | ICD-10-CM

## 2019-07-10 DIAGNOSIS — IMO0002 Reserved for concepts with insufficient information to code with codable children: Secondary | ICD-10-CM

## 2019-07-10 DIAGNOSIS — R251 Tremor, unspecified: Secondary | ICD-10-CM

## 2019-07-10 DIAGNOSIS — G40909 Epilepsy, unspecified, not intractable, without status epilepticus: Secondary | ICD-10-CM

## 2019-07-10 NOTE — Progress Notes (Signed)
   Virtual Visit via Video Note The purpose of this virtual visit is to provide medical care while limiting exposure to the novel coronavirus.    Consent was obtained for video visit:  Yes.   Answered questions that patient had about telehealth interaction:  Yes.   I discussed the limitations, risks, security and privacy concerns of performing an evaluation and management service by telemedicine. I also discussed with the patient that there may be a patient responsible charge related to this service. The patient expressed understanding and agreed to proceed.  Pt location: Home Physician Location: office Name of referring provider:  Belva Bertin, [place], * I connected with Salvadore Dom at patients initiation/request on 07/10/2019 at  2:30 PM EST by video enabled telemedicine application and verified that I am speaking with the correct person using two identifiers. Pt MRN:  [id number] Pt DOB:  11-29-1985 Video Participants:  Salvadore Dom   History of Present Illness: This is a 33 y.o. female returning for follow-up of migraines, seizures, and and new complaints of tremors.  She began noticing tremors of the hands,worse on the left,when doing fine motor tasks over the past 1.5 months.  Symptoms are bothersome because it limits her ability to safely use a straightening iron, prepare meals, and hold utensils.  She denies any weakness.    She also complains of nosebleeds and spells of discoloration of the fingertips, especially when cold. She has not mentioned these symptoms to her PCP.  Observations/Objective:   Vitals:   07/06/19 1143  Weight: 146 lb (66.2 kg)  Height: 5\' 3"  (1.6 m)   Patient is awake, alert, and appears comfortable.  Oriented x 4.   Extraocular muscles are intact. No ptosis.  Face is symmetric.  Speech is not dysarthric.  Antigravity in all extremities. No tremor with hands outstretched.  Finger tapping intact.   Assessment and Plan:  1.  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    - Monitor symptoms  - If tremors starts to interfere with quality of life, start low dose propranolol  2.  Chronic migraine, stable  - Continue nortrptyline 75mg  qhs  3.  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  Epistasis, ?Raynaud's phenomenon - follow-up with PCP   Follow Up Instructions:   I discussed the assessment and treatment plan with the patient. The patient was provided an opportunity to ask questions and all were answered. The patient agreed with the plan and demonstrated an understanding of the instructions.   The patient was advised to call back or seek an in-person evaluation if the symptoms worsen or if the condition fails to improve as anticipated.  Total time:  15 min  Alda Berthold, DO

## 2019-08-11 ENCOUNTER — Other Ambulatory Visit: Payer: Self-pay

## 2019-08-14 ENCOUNTER — Ambulatory Visit: Payer: BC Managed Care – PPO | Admitting: Women's Health

## 2019-08-14 ENCOUNTER — Other Ambulatory Visit: Payer: Self-pay

## 2019-08-14 ENCOUNTER — Encounter: Payer: Self-pay | Admitting: Women's Health

## 2019-08-14 VITALS — BP 120/78 | Ht 63.0 in | Wt 160.0 lb

## 2019-08-14 DIAGNOSIS — Z01419 Encounter for gynecological examination (general) (routine) without abnormal findings: Secondary | ICD-10-CM | POA: Diagnosis not present

## 2019-08-14 NOTE — Patient Instructions (Signed)
It was good to see you today Vitamin D 1000 IUs daily Health Maintenance, Female Adopting a healthy lifestyle and getting preventive care are important in promoting health and wellness. Ask your health care provider about:  The right schedule for you to have regular tests and exams.  Things you can do on your own to prevent diseases and keep yourself healthy. What should I know about diet, weight, and exercise? Eat a healthy diet   Eat a diet that includes plenty of vegetables, fruits, low-fat dairy products, and lean protein.  Do not eat a lot of foods that are high in solid fats, added sugars, or sodium. Maintain a healthy weight Body mass index (BMI) is used to identify weight problems. It estimates body fat based on height and weight. Your health care provider can help determine your BMI and help you achieve or maintain a healthy weight. Get regular exercise Get regular exercise. This is one of the most important things you can do for your health. Most adults should:  Exercise for at least 150 minutes each week. The exercise should increase your heart rate and make you sweat (moderate-intensity exercise).  Do strengthening exercises at least twice a week. This is in addition to the moderate-intensity exercise.  Spend less time sitting. Even light physical activity can be beneficial. Watch cholesterol and blood lipids Have your blood tested for lipids and cholesterol at 32 years of age, then have this test every 5 years. Have your cholesterol levels checked more often if:  Your lipid or cholesterol levels are high.  You are older than 32 years of age.  You are at high risk for heart disease. What should I know about cancer screening? Depending on your health history and family history, you may need to have cancer screening at various ages. This may include screening for:  Breast cancer.  Cervical cancer.  Colorectal cancer.  Skin cancer.  Lung cancer. What should I  know about heart disease, diabetes, and high blood pressure? Blood pressure and heart disease  High blood pressure causes heart disease and increases the risk of stroke. This is more likely to develop in people who have high blood pressure readings, are of African descent, or are overweight.  Have your blood pressure checked: ? Every 3-5 years if you are 18-39 years of age. ? Every year if you are 40 years old or older. Diabetes Have regular diabetes screenings. This checks your fasting blood sugar level. Have the screening done:  Once every three years after age 40 if you are at a normal weight and have a low risk for diabetes.  More often and at a younger age if you are overweight or have a high risk for diabetes. What should I know about preventing infection? Hepatitis B If you have a higher risk for hepatitis B, you should be screened for this virus. Talk with your health care provider to find out if you are at risk for hepatitis B infection. Hepatitis C Testing is recommended for:  Everyone born from 1945 through 1965.  Anyone with known risk factors for hepatitis C. Sexually transmitted infections (STIs)  Get screened for STIs, including gonorrhea and chlamydia, if: ? You are sexually active and are younger than 32 years of age. ? You are older than 32 years of age and your health care provider tells you that you are at risk for this type of infection. ? Your sexual activity has changed since you were last screened, and you are at   increased risk for chlamydia or gonorrhea. Ask your health care provider if you are at risk.  Ask your health care provider about whether you are at high risk for HIV. Your health care provider may recommend a prescription medicine to help prevent HIV infection. If you choose to take medicine to prevent HIV, you should first get tested for HIV. You should then be tested every 3 months for as long as you are taking the medicine. Pregnancy  If you are  about to stop having your period (premenopausal) and you may become pregnant, seek counseling before you get pregnant.  Take 400 to 800 micrograms (mcg) of folic acid every day if you become pregnant.  Ask for birth control (contraception) if you want to prevent pregnancy. Osteoporosis and menopause Osteoporosis is a disease in which the bones lose minerals and strength with aging. This can result in bone fractures. If you are 65 years old or older, or if you are at risk for osteoporosis and fractures, ask your health care provider if you should:  Be screened for bone loss.  Take a calcium or vitamin D supplement to lower your risk of fractures.  Be given hormone replacement therapy (HRT) to treat symptoms of menopause. Follow these instructions at home: Lifestyle  Do not use any products that contain nicotine or tobacco, such as cigarettes, e-cigarettes, and chewing tobacco. If you need help quitting, ask your health care provider.  Do not use street drugs.  Do not share needles.  Ask your health care provider for help if you need support or information about quitting drugs. Alcohol use  Do not drink alcohol if: ? Your health care provider tells you not to drink. ? You are pregnant, may be pregnant, or are planning to become pregnant.  If you drink alcohol: ? Limit how much you use to 0-1 drink a day. ? Limit intake if you are breastfeeding.  Be aware of how much alcohol is in your drink. In the [place] one drink equals one 12 oz bottle of beer (355 mL), one 5 oz glass of wine (148 mL), or one 1 oz glass of hard liquor (44 mL). General instructions  Schedule regular health, dental, and eye exams.  Stay current with your vaccines.  Tell your health care provider if: ? You often feel depressed. ? You have ever been abused or do not feel safe at home. Summary  Adopting a healthy lifestyle and getting preventive care are important in promoting health and wellness.  Follow  your health care provider's instructions about healthy diet, exercising, and getting tested or screened for diseases.  Follow your health care provider's instructions on monitoring your cholesterol and blood pressure. This information is not intended to replace advice given to you by your health care provider. Make sure you discuss any questions you have with your health care provider. Document Revised: 06/22/2018 Document Reviewed: 06/22/2018 Elsevier Patient Education  2020 Elsevier Inc.  

## 2019-08-14 NOTE — Progress Notes (Signed)
Candace Stephens [birth year] [id number]    History:    Presents for annual exam.  06/2017 LAVH with bilateral salpingectomy for pelvic pain/endometriosis .  Same partner of 9 years recent engagement.  Normal Pap history.  Anxiety/depression, history of seizures on Keppra, primary care manages labs and meds.  14 pound weight gain in past year.  Past medical history, past surgical history, family history and social history were all reviewed and documented in the EPIC chart.  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  Mother hypertension.  History of an appendectomy.  ROS:  A ROS was performed and pertinent positives and negatives are included.  Exam:  Vitals:   08/14/19 1455  BP: 120/78  Weight: 160 lb (72.6 kg)  Height: 5\' 3"  (1.6 m)   Body mass index is 28.34 kg/m.   General appearance:  Normal Thyroid:  Symmetrical, normal in size, without palpable masses or nodularity. Respiratory  Auscultation:  Clear without wheezing or rhonchi Cardiovascular  Auscultation:  Regular rate, without rubs, murmurs or gallops  Edema/varicosities:  Not grossly evident Abdominal  Soft,nontender, without masses, guarding or rebound.  Liver/spleen:  No organomegaly noted  Hernia:  None appreciated  Skin  Inspection:  Grossly normal   Breasts: Examined lying and sitting.     Right: Without masses, retractions, discharge or axillary adenopathy.     Left: Without masses, retractions, discharge or axillary adenopathy. Gentitourinary   Inguinal/mons:  Normal without inguinal adenopathy  External genitalia:  Normal  BUS/Urethra/Skene's glands:  Normal  Vagina:  Normal  Cervix: And uterus absent   Adnexa/parametria:     Rt: Without masses or tenderness.   Lt: Without masses or tenderness.  Anus and perineum: Normal  Digital rectal exam: Normal sphincter tone without palpated masses or tenderness  Assessment/Plan:  34 y.o. engaged BF G1, P1 with complaint of excruciating abdominal pain that radiated to her  back mostly on the left side 1 week ago which has now resolved.  Questions if it was an ovarian cyst.  06/2017 LAVH with bilateral salpingectomies Anxiety/depression, history of seizures on Keppra primary care manages labs and meds  Plan: SBEs, annual screening mammogram at 40, calcium rich foods, vitamin D 1000 IUs daily.  Reviewed importance of increasing exercise and decreasing calories/carbs, 14 pound weight gain in the past year.  Reviewed most likely it was an ovarian cyst last week, since symptoms have resolved will watch at this time.  Reviewed importance of self-care, leisure activities.  Pap screening guidelines reviewed.  Best wishes given on recent engagement.    07/2017 [facility], 2:56 PM 08/14/2019

## 2019-08-15 LAB — URINALYSIS, COMPLETE W/RFL CULTURE
Bacteria, UA: NONE SEEN /HPF
Bilirubin Urine: NEGATIVE
Glucose, UA: NEGATIVE
Hgb urine dipstick: NEGATIVE
Hyaline Cast: NONE SEEN /LPF
Ketones, ur: NEGATIVE
Leukocyte Esterase: NEGATIVE
Nitrites, Initial: NEGATIVE
Protein, ur: NEGATIVE
RBC / HPF: NONE SEEN /HPF (ref 0–2)
Specific Gravity, Urine: 1.022 (ref 1.001–1.03)
WBC, UA: NONE SEEN /HPF (ref 0–5)
pH: 6.5 (ref 5.0–8.0)

## 2019-08-15 LAB — NO CULTURE INDICATED

## 2019-08-17 ENCOUNTER — Encounter: Payer: BC Managed Care – PPO | Admitting: Obstetrics & Gynecology

## 2019-08-28 ENCOUNTER — Encounter: Payer: BC Managed Care – PPO | Admitting: Obstetrics & Gynecology

## 2019-09-01 ENCOUNTER — Other Ambulatory Visit: Payer: Self-pay | Admitting: Neurology

## 2019-09-07 ENCOUNTER — Ambulatory Visit: Payer: BC Managed Care – PPO | Attending: Internal Medicine

## 2019-09-07 ENCOUNTER — Other Ambulatory Visit: Payer: Self-pay

## 2019-09-07 ENCOUNTER — Ambulatory Visit: Payer: BC Managed Care – PPO

## 2019-09-07 DIAGNOSIS — Z23 Encounter for immunization: Secondary | ICD-10-CM | POA: Insufficient documentation

## 2019-09-07 NOTE — Progress Notes (Signed)
   Covid-19 Vaccination Clinic  Name:  Candace Stephens    MRN: [id number] DOB: 11/14/1985  09/07/2019  [name] was observed post Covid-19 immunization for 15 minutes without incidence. She was provided with Vaccine Information Sheet and instruction to access the V-Safe system.   [name] was instructed to call 911 with any severe reactions post vaccine: Marland Kitchen Difficulty breathing  . Swelling of your face and throat  . A fast heartbeat  . A bad rash all over your body  . Dizziness and weakness    Immunizations Administered    Name Date Dose VIS Date Route   Moderna COVID-19 Vaccine 09/07/2019  2:42 PM 0.5 mL 06/13/2019 Intramuscular   Manufacturer: Moderna   Lot: [id number]   NDC: 53317-409-92

## 2019-09-28 ENCOUNTER — Encounter: Payer: Self-pay | Admitting: Neurology

## 2019-09-28 ENCOUNTER — Telehealth (INDEPENDENT_AMBULATORY_CARE_PROVIDER_SITE_OTHER): Payer: BC Managed Care – PPO | Admitting: Neurology

## 2019-09-28 ENCOUNTER — Other Ambulatory Visit: Payer: Self-pay

## 2019-09-28 DIAGNOSIS — G43709 Chronic migraine without aura, not intractable, without status migrainosus: Secondary | ICD-10-CM

## 2019-09-28 DIAGNOSIS — G40909 Epilepsy, unspecified, not intractable, without status epilepticus: Secondary | ICD-10-CM | POA: Diagnosis not present

## 2019-09-28 DIAGNOSIS — R251 Tremor, unspecified: Secondary | ICD-10-CM | POA: Diagnosis not present

## 2019-09-28 DIAGNOSIS — IMO0002 Reserved for concepts with insufficient information to code with codable children: Secondary | ICD-10-CM

## 2019-09-28 MED ORDER — LEVETIRACETAM ER 500 MG PO TB24: ORAL_TABLET | ORAL | 3 refills | Status: DC

## 2019-09-28 NOTE — Progress Notes (Signed)
   Virtual Visit via Video Note The purpose of this virtual visit is to provide medical care while limiting exposure to the novel coronavirus.    Consent was obtained for video visit:  Yes.   Answered questions that patient had about telehealth interaction:  Yes.   I discussed the limitations, risks, security and privacy concerns of performing an evaluation and management service by telemedicine. I also discussed with the patient that there may be a patient responsible charge related to this service. The patient expressed understanding and agreed to proceed.  Pt location: Home Physician Location: office Name of referring provider:  Piedad Climes, [place], * I connected with Delanna Notice at patients initiation/request on 09/28/2019 at  9:50 AM EDT by video enabled telemedicine application and verified that I am speaking with the correct person using two identifiers. Pt MRN:  [id number] Pt DOB:  1985/10/31 Video Participants:  Delanna Notice   History of Present Illness: This is a 34 y.o. female returning for follow-up of migraines, tremor, and seizure disorder.   Headaches are stable, she gets dull tension headaches about 3-4 times per week, lasting about up to one day.  These headaches are not severe and she is able to function through them.  Migraines remain well controlled on nortriptyline 75 mg daily. She has not had any interval seizures and is tolerating Keppra XL 1000 mg at bedtime, but notes that the cost of the medication was $5 and now $30 for 30-day supply. Tremors are stable and occur mostly when focusing on doing tasks.  It does not interfere with her day-to-day activities.   Observations/Objective:   There were no vitals filed for this visit. Patient is awake, alert, and appears comfortable.   No ptosis.  Face is symmetric.  Speech is not dysarthric.  Antigravity in all extremities.   Assessment and Plan:  1.  Chronic migraine, stable  -Continue nortriptyline 75 mg at  bedtime  2.  Essential tremor, mild  -Symptoms do not interfere with day-to-day activities, continue to monitor.  3.  Generalized seizure disorder in the setting of heart disease, stable and seizure-free  - Continue Keppra XL 1000mg  at bedtime.  I advised her to try using GoodRx and inquire with her pharmacy about potential coupons that she can use to offset her out-of-pocket cost for the medication.   Follow Up Instructions:   I discussed the assessment and treatment plan with the patient. The patient was provided an opportunity to ask questions and all were answered. The patient agreed with the plan and demonstrated an understanding of the instructions.   The patient was advised to call back or seek an in-person evaluation if the symptoms worsen or if the condition fails to improve as anticipated.  Follow-up in 9 months   Ariele Vidrio , DO

## 2019-09-29 ENCOUNTER — Telehealth: Payer: BC Managed Care – PPO | Admitting: Neurology

## 2019-10-10 ENCOUNTER — Ambulatory Visit: Payer: BC Managed Care – PPO

## 2019-10-12 ENCOUNTER — Telehealth: Payer: Self-pay | Admitting: *Deleted

## 2019-10-12 NOTE — Telephone Encounter (Addendum)
Scheduled pt in error; pt notified; attempted to contact POC at [facility]: [facility] [employer] [facility] via cell phone but message states voicemail has not been set up; pt given number to reach the POC; she repeated number for correctness;  previous appt cancelled.

## 2019-10-12 NOTE — Telephone Encounter (Signed)
Pt called because she would like to schedule her 2nd Moderna vaccine; she was told that she was outside her 28 day window; pt was previously vaccinated at [facility] [employer] [facility] 09/07/19; pt offered appt at [facility] [employer] [facility] 10/17/19 at 1000; she verbalized understanding.

## 2019-10-17 ENCOUNTER — Ambulatory Visit: Payer: BC Managed Care – PPO

## 2019-10-17 ENCOUNTER — Ambulatory Visit: Payer: BC Managed Care – PPO | Attending: Family

## 2019-10-17 DIAGNOSIS — Z23 Encounter for immunization: Secondary | ICD-10-CM

## 2019-10-17 NOTE — Progress Notes (Signed)
   Covid-19 Vaccination Clinic  Name:  Candace Stephens    MRN: [id number] DOB: 10-07-1985  10/17/2019  [name] was observed post Covid-19 immunization for 15 minutes without incident. She was provided with Vaccine Information Sheet and instruction to access the V-Safe system.   [name] was instructed to call 911 with any severe reactions post vaccine: Marland Kitchen Difficulty breathing  . Swelling of face and throat  . A fast heartbeat  . A bad rash all over body  . Dizziness and weakness   Immunizations Administered    Name Date Dose VIS Date Route   Moderna COVID-19 Vaccine 10/17/2019  9:46 AM 0.5 mL 06/13/2019 Intramuscular   Manufacturer: Moderna   Lot: [id number]   NDC: 98921-194-17

## 2019-11-16 ENCOUNTER — Telehealth: Payer: BC Managed Care – PPO | Admitting: Emergency Medicine

## 2019-11-16 DIAGNOSIS — J029 Acute pharyngitis, unspecified: Secondary | ICD-10-CM | POA: Diagnosis not present

## 2019-11-16 NOTE — Progress Notes (Signed)
We are sorry that you are not feeling well.  Here is how we plan to help!  Your symptoms indicate a likely viral infection (Pharyngitis).    In order to meet diagnostic criteria for empiric antibiotics for strep throat, you need to have the following.  -Be age 32-63 years old. -Have exudates (white spots) on throat. -Have swollen lymph nodes on your neck -Have fever greater than 100.4. -Not have any cough.  Based on your answers, you don't qualify for empiric antibiotic treatment (treatment without testing), but you should be tested.  This is not something that I can order for you through and e-visit.    You should ask your doctor at your appointment today to be tested for strep throat.  In the meantime, try the tips below to help with your symptoms.  Pharyngitis is inflammation in the back of the throat which can cause a sore throat, scratchiness and sometimes difficulty swallowing.   Pharyngitis is typically caused by a respiratory virus and will just run its course.  Please keep in mind that your symptoms could last up to 10 days.  For throat pain, we recommend over the counter oral pain relief medications such as acetaminophen or aspirin, or anti-inflammatory medications such as ibuprofen or naproxen sodium.  Topical treatments such as oral throat lozenges or sprays may be used as needed.  Avoid close contact with loved ones, especially the very young and elderly.  Remember to wash your hands thoroughly throughout the day as this is the number one way to prevent the spread of infection and wipe down door knobs and counters with disinfectant.  After careful review of your answers, I would not recommend and antibiotic for your condition.  Antibiotics should not be used to treat conditions that we suspect are caused by viruses like the virus that causes the common cold or flu. However, some people can have Strep with atypical symptoms. You may need formal testing in clinic or office to confirm if  your symptoms continue or worsen.  Providers prescribe antibiotics to treat infections caused by bacteria. Antibiotics are very powerful in treating bacterial infections when they are used properly.  To maintain their effectiveness, they should be used only when necessary.  Overuse of antibiotics has resulted in the development of super bugs that are resistant to treatment!    Home Care:  Only take medications as instructed by your medical team.  Do not drink alcohol while taking these medications.  A steam or ultrasonic humidifier can help congestion.  You can place a towel over your head and breathe in the steam from hot water coming from a faucet.  Avoid close contacts especially the very young and the elderly.  Cover your mouth when you cough or sneeze.  Always remember to wash your hands.  Get Help Right Away If:  You develop worsening fever or throat pain.  You develop a severe head ache or visual changes.  Your symptoms persist after you have completed your treatment plan.  Make sure you  Understand these instructions.  Will watch your condition.  Will get help right away if you are not doing well or get worse.  Your e-visit answers were reviewed by a board certified advanced clinical practitioner to complete your personal care plan.  Depending on the condition, your plan could have included both over the counter or prescription medications.  If there is a problem please reply  once you have received a response from your provider.  Your safety  is important to [place].  If you have drug allergies check your prescription carefully.    You can use MyChart to ask questions about todays visit, request a non-urgent call back, or ask for a work or school excuse for 24 hours related to this e-Visit. If it has been greater than 24 hours you will need to follow up with your provider, or enter a new e-Visit to address those concerns.  You will get an e-mail in the next two days  asking about your experience.  I hope that your e-visit has been valuable and will speed your recovery. Thank you for using e-visits.   Approximately 5 minutes was used in reviewing the patient's chart, questionnaire, prescribing medications, and documentation.

## 2020-05-07 ENCOUNTER — Encounter: Payer: Self-pay | Admitting: Obstetrics & Gynecology

## 2020-05-07 ENCOUNTER — Other Ambulatory Visit: Payer: Self-pay

## 2020-05-07 ENCOUNTER — Telehealth: Payer: Self-pay | Admitting: *Deleted

## 2020-05-07 ENCOUNTER — Ambulatory Visit: Payer: BC Managed Care – PPO | Admitting: Obstetrics & Gynecology

## 2020-05-07 VITALS — BP 134/86

## 2020-05-07 DIAGNOSIS — Z90710 Acquired absence of both cervix and uterus: Secondary | ICD-10-CM | POA: Diagnosis not present

## 2020-05-07 DIAGNOSIS — R1031 Right lower quadrant pain: Secondary | ICD-10-CM | POA: Diagnosis not present

## 2020-05-07 DIAGNOSIS — R102 Pelvic and perineal pain: Secondary | ICD-10-CM

## 2020-05-07 LAB — CBC
HCT: 38.4 % (ref 35.0–45.0)
Hemoglobin: 12.7 g/dL (ref 11.7–15.5)
MCH: 27.4 pg (ref 27.0–33.0)
MCHC: 33.1 g/dL (ref 32.0–36.0)
MCV: 82.8 fL (ref 80.0–100.0)
MPV: 10.4 fL (ref 7.5–12.5)
Platelets: 211 10*3/uL (ref 140–400)
RBC: 4.64 10*6/uL (ref 3.80–5.10)
RDW: 13.6 % (ref 11.0–15.0)
WBC: 4.2 10*3/uL (ref 3.8–10.8)

## 2020-05-07 NOTE — Telephone Encounter (Signed)
-----   Message from Genia Del, MD sent at 05/07/2020 11:20 AM EDT ----- Regarding: Pelvic US today Right pelvic pain x 4 days.  R/O Rt Ovarian Cyst/Torsion?

## 2020-05-07 NOTE — Telephone Encounter (Addendum)
Dr.Lavoie No appointments at [facility] for today, however they do have appointment on 05/09/20 @ 10:45am patient informed with this as well and was okay with this time and date.

## 2020-05-07 NOTE — Progress Notes (Signed)
    SIMRA FIEBIG July 02, 1986 [id number]        34 y.o.  G1P1L1 Stable boyfriend  RP: Rt pelvic pain x 4 days  HPI: S/P LAVH/Bilateral Salpingectomy 06/2017.  H/O Lower back pain.  H/O Kidney stones.  Patient c/o severe Rt pelvic pain x 4 days.  Difficult to find a comfortable position.  Fell asleep last night after turning and tossing because of discomfort.  No vaginal discharge.  Not recently sexually active.  Declines STI screen.  No UTI Sx.  BMs normal. Eating and drinking.  No meds taken.  No fever.   OB History  Gravida Para Term Preterm AB Living  1 1       1   SAB TAB Ectopic Multiple Live Births               # Outcome Date GA Lbr Len/2nd Weight Sex Delivery Anes PTL Lv  1 Para             Past medical history,surgical history, problem list, medications, allergies, family history and social history were all reviewed and documented in the EPIC chart.   Directed ROS with pertinent positives and negatives documented in the history of present illness/assessment and plan.  Exam:  Vitals:   05/07/20 1055  BP: 134/86   General appearance:  Normal  Abdomen: Soft, not distended.  Tender to palpation Rt > Lt.  BS present.  No mass felt.  Gynecologic exam: Vulva normal.  Bimanual exam:  S/P Total Hyst.  Vagina normal.  Normal secretions.  No pelvic mass felt.  Tender to palpation Rt >Lt.   Assessment/Plan:  34 y.o. G1P1   1. Abdominal pain, acute, right lower quadrant  Pain possibly associated with a right ovarian cyst.  Ovarian cyst rupture or ovarian torsion to rule out.  No acute abdomen.  Possible kidney stone or lower back pain at the origin of the symptoms.  CBC to check white blood cell count.  Ibuprofen 800 mg per mouth time 1 dose given in the office.  Recommend using ibuprofen regularly for the next 24 hours.  Schedule Pelvic 34 today at a Radiology Center, no Pelvic US available in our office today. - CBC  2. S/P laparoscopic assisted vaginal hysterectomy  (LAVH)  [place] MD, 11:17 AM 05/07/2020

## 2020-05-07 NOTE — Telephone Encounter (Signed)
Don't we have appointments for Pelvic US here on 10/28th?  If we do, I would prefer.

## 2020-05-07 NOTE — Telephone Encounter (Signed)
Candace Stephens see below and let me know.

## 2020-05-08 NOTE — Telephone Encounter (Signed)
Order placed

## 2020-05-08 NOTE — Telephone Encounter (Signed)
Patient coming in for ultrasound and to see Dr Seymour Bars on October 28.

## 2020-05-09 ENCOUNTER — Ambulatory Visit: Payer: BC Managed Care – PPO | Admitting: Obstetrics & Gynecology

## 2020-05-09 ENCOUNTER — Other Ambulatory Visit: Payer: BC Managed Care – PPO

## 2020-05-09 ENCOUNTER — Ambulatory Visit (INDEPENDENT_AMBULATORY_CARE_PROVIDER_SITE_OTHER): Payer: BC Managed Care – PPO

## 2020-05-09 ENCOUNTER — Other Ambulatory Visit: Payer: Self-pay

## 2020-05-09 DIAGNOSIS — Z90710 Acquired absence of both cervix and uterus: Secondary | ICD-10-CM

## 2020-05-09 DIAGNOSIS — R102 Pelvic and perineal pain: Secondary | ICD-10-CM | POA: Diagnosis not present

## 2020-05-09 DIAGNOSIS — N83209 Unspecified ovarian cyst, unspecified side: Secondary | ICD-10-CM

## 2020-05-09 MED ORDER — NORETHINDRONE 0.35 MG PO TABS: 1.0000 | ORAL_TABLET | Freq: Every day | ORAL | 4 refills | Status: DC

## 2020-05-09 NOTE — Progress Notes (Signed)
    Candace Stephens 1985/09/04 [id number]        34 y.o.  G1P1   RP: Acute severe pelvic pain 2 days ago  HPI: S/P LAVH.  Severe acute Rt pelvic pain 2 days ago which lasted 3-4 days.  Now completely resolved.  No abnormal vaginal discharge.  Declined STI work-up.   OB History  Gravida Para Term Preterm AB Living  1 1       1   SAB TAB Ectopic Multiple Live Births               # Outcome Date GA Lbr Len/2nd Weight Sex Delivery Anes PTL Lv  1 Para             Past medical history,surgical history, problem list, medications, allergies, family history and social history were all reviewed and documented in the EPIC chart.   Directed ROS with pertinent positives and negatives documented in the history of present illness/assessment and plan.  Exam:  There were no vitals filed for this visit. General appearance:  Normal  Pelvic today: T/V images.  Status post total hysterectomy.  Vaginal cuff appears negative.  Both ovaries are normal in size with normal follicular pattern.  Right ovary with a collapsed corpus luteum measured at 12.4 x 10.6 mm.  No adnexal mass.  Mild clear free fluid in the posterior cul-de-sac.  CBC 05/07/2020 Normal with WBC at 4.2 and Hb at 12.7.   Assessment/Plan:  34 y.o. G1P1   1. Pelvic pain in female Resolved acute right pelvic pain probably associated with ruptured functional right ovarian cyst.  Pelvic ultrasound findings thoroughly reviewed with patient.  Pelvic ultrasound status post total hysterectomy with normal vaginal cuff.  Both ovaries normal in size with normal follicular pattern with right ovary showing a collapsed corpus luteum measuring 1.2 cm.  No adnexal mass.  Mild clear fluid in the posterior cul-de-sac.  Decision to start on the progestin only pill to lower the risk of functional cysts associated with rupture and pain.  No contraindication to the progestin pill.  Usage reviewed.  Prescription sent to pharmacy.  2. Ruptured ovarian  cyst Probable right functional ovarian cyst rupture.  Resolved right pelvic pain.  3. S/P total hysterectomy  Other orders - norethindrone (CAMILA) 0.35 MG tablet; Take 1 tablet (0.35 mg total) by mouth daily.  36 MD, 9:28 AM 05/09/2020

## 2020-05-10 ENCOUNTER — Encounter: Payer: Self-pay | Admitting: Obstetrics & Gynecology

## 2020-05-16 ENCOUNTER — Encounter: Payer: Self-pay | Admitting: Neurology

## 2020-05-16 ENCOUNTER — Telehealth (INDEPENDENT_AMBULATORY_CARE_PROVIDER_SITE_OTHER): Payer: BC Managed Care – PPO | Admitting: Neurology

## 2020-05-16 ENCOUNTER — Other Ambulatory Visit: Payer: Self-pay

## 2020-05-16 VITALS — Ht 63.0 in | Wt 164.0 lb

## 2020-05-16 DIAGNOSIS — R251 Tremor, unspecified: Secondary | ICD-10-CM

## 2020-05-16 DIAGNOSIS — G43009 Migraine without aura, not intractable, without status migrainosus: Secondary | ICD-10-CM | POA: Diagnosis not present

## 2020-05-16 DIAGNOSIS — G40909 Epilepsy, unspecified, not intractable, without status epilepticus: Secondary | ICD-10-CM | POA: Diagnosis not present

## 2020-05-16 DIAGNOSIS — G238 Other specified degenerative diseases of basal ganglia: Secondary | ICD-10-CM | POA: Diagnosis not present

## 2020-05-16 MED ORDER — NORTRIPTYLINE HCL 25 MG PO CAPS: ORAL_CAPSULE | ORAL | 3 refills | Status: DC

## 2020-05-16 MED ORDER — NORTRIPTYLINE HCL 50 MG PO CAPS: ORAL_CAPSULE | ORAL | 3 refills | Status: DC

## 2020-05-16 NOTE — Progress Notes (Signed)
   Virtual Visit via Video Note The purpose of this virtual visit is to provide medical care while limiting exposure to the novel coronavirus.    Consent was obtained for video visit:  Yes.   Answered questions that patient had about telehealth interaction:  Yes.   I discussed the limitations, risks, security and privacy concerns of performing an evaluation and management service by telemedicine. I also discussed with the patient that there may be a patient responsible charge related to this service. The patient expressed understanding and agreed to proceed.  Pt location: Home Physician Location: office Name of referring provider:  Piedad Climes, [place], * I connected with Delanna Notice at patients initiation/request on 05/16/2020 at 10:30 AM EDT by video enabled telemedicine application and verified that I am speaking with the correct person using two identifiers. Pt MRN:  [id number] Pt DOB:  1985/12/06 Video Participants:  Delanna Notice   History of Present Illness: This is a 34 y.o. female returning for follow-up of chronic headaches, tremors, and seizures.  She denies any new complaints.  Headaches are very well-controlled on nortriptyline 75mg  at bedtime.  There has been no worsening with her tremors which remain mild.  No interval seizures.  She is compliant with Keppra 1000XL daily.   Observations/Objective:   Vitals:   05/16/20 0918  Weight: 164 lb (74.4 kg)  Height: 5\' 3"  (1.6 m)   Patient is awake, alert, and appears comfortable.  Oriented x 4.   Extraocular muscles are intact. No ptosis.  Face is symmetric.  Speech is not dysarthric.  Antigravity in all extremities.    Assessment and Plan:  1.  Chronic migraine, stable and well-controlled  - Continue nortriptyline 75mg  at bedtime  2.  Essential tremor, mild.    - Monitor  3.  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  She remains seizure-free  - Continue Keppra XL 1000mg  at  bedtime   Follow Up Instructions:   I discussed the assessment and treatment plan with the patient. The patient was provided an opportunity to ask questions and all were answered. The patient agreed with the plan and demonstrated an understanding of the instructions.   The patient was advised to call back or seek an in-person evaluation if the symptoms worsen or if the condition fails to improve as anticipated.  Follow-up in 9 months   Shanetra Blumenstock 13/04/21, DO

## 2020-06-14 ENCOUNTER — Ambulatory Visit: Payer: BC Managed Care – PPO | Admitting: Neurology

## 2020-06-25 ENCOUNTER — Other Ambulatory Visit: Payer: Self-pay | Admitting: Neurology

## 2020-08-14 ENCOUNTER — Encounter: Payer: BC Managed Care – PPO | Admitting: Obstetrics & Gynecology

## 2020-09-22 ENCOUNTER — Other Ambulatory Visit: Payer: Self-pay | Admitting: Neurology

## 2020-09-30 ENCOUNTER — Other Ambulatory Visit: Payer: Self-pay

## 2020-09-30 ENCOUNTER — Telehealth: Payer: Self-pay | Admitting: Neurology

## 2020-09-30 ENCOUNTER — Other Ambulatory Visit: Payer: Self-pay | Admitting: Neurology

## 2020-09-30 MED ORDER — LEVETIRACETAM ER 500 MG PO TB24: ORAL_TABLET | ORAL | 0 refills | Status: DC

## 2020-09-30 NOTE — Telephone Encounter (Signed)
Patient called in needing a refill on her Keppra sent to [facility] on [address]

## 2020-10-16 NOTE — Progress Notes (Signed)
Follow-up Visit   Date: 10/17/20    Candace Stephens MRN: [id number] DOB: 04-24-1986   Interim History: Candace Stephens is a 35 y.o. 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  She was previously seen for migraine and seizure disorder.    Starting 4-days ago, she began having ringing in the right ear and spinning sensation.  Symptoms are worse with head movements.  She has some nausea.  She has vertigo about 5-6 times in the past.  It typically improves with meclizine, but this time did not find any benefit.  She was also started on lorazepam by her PCP.    Migraines and seizures remain well-controlled. She has have worsening headache over the right side with her tinnitus.   Medications:  Current Outpatient Medications on File Prior to Visit  Medication Sig Dispense Refill  . citalopram (CELEXA) 20 MG tablet Take 30 mg by mouth daily.    Marland Kitchen levETIRAcetam (KEPPRA XR) 500 MG 24 hr tablet TAKE 2 TABLETS BY MOUTH EVERY NIGHT AT BEDTIME 60 tablet 0  . LORazepam (ATIVAN) 0.5 MG tablet Take 0.5 mg by mouth 3 (three) times daily as needed.    . meclizine (ANTIVERT) 25 MG tablet Take 12.5-25 mg by mouth 3 (three) times daily as needed.    . norethindrone (CAMILA) 0.35 MG tablet Take 1 tablet (0.35 mg total) by mouth daily. 84 tablet 4  . nortriptyline (PAMELOR) 25 MG capsule TAKE 1 CAPSULE(25 MG) BY MOUTH AT BEDTIME 90 capsule 3  . nortriptyline (PAMELOR) 50 MG capsule TAKE 1 CAPSULE(50 MG) BY MOUTH AT BEDTIME in combination with 25mg  tablet  = 75mg  at bedtime 90 capsule 3   No current facility-administered medications on file prior to visit.    Allergies:  Allergies  Allergen Reactions  . Hydrocodone Itching  . Sumatriptan Other (See Comments)    Unknown  . Topiramate Other (See Comments)    Kidney stones  . Hydrocodone-Acetaminophen Itching    Vital Signs:  BP (!) 144/93   Pulse (!)  107   Ht 5\' 3"  (1.6 m)   Wt 173 lb 12.8 oz (78.8 kg)   LMP 06/14/2017   SpO2 98%   BMI 30.79 kg/m   Neurological Exam: MENTAL STATUS including orientation to time, place, person, recent and remote memory, attention span and concentration, language, and fund of knowledge is normal.  Speech is not dysarthric.  CRANIAL NERVES:   Pupils are round and reactive to light.  Extraocular muscles intact bilaterally. Nystagmus with lateral gaze, worse on the left.  No ptosis.  Face is symmetric. No abnormal movement. Tongue is midline.   MOTOR:  Motor strength is 5/5 in all extremities.  Tone is normal.   SENSATION:  Vibration intact throughout  REFLEXES:  Reflexes are brisk and symmetric throughout (2+/4)  COORDINATION/GAIT:  Gait is very slow, wide-based, cautious, due to imbalance.   Data: MRI lumbar spine wo contrast 04/15/2016:  No lumbar spine pathology of significance.  Greater than 4 cm RIGHT adnexal cystic lesion. Recommend pelvic Ultrasound.  MRI cervical spine wo contrast 04/15/2016:  Normal examination of the cervical spine. No post traumatic finding.  No abnormality seen to explain the presenting symptoms.  NCS/EMG of the left arm 06/30/2016:  Normal  NCS/EMG of the left arm 07/27/2017:  Normal  Routine EEG 07/01/2016:  Normal  CT head 06/29/2016: 1. Prominent bilateral basal ganglia calcification, unusual in  a patient of this age. Consider evaluation for metabolic abnormalities (such as thyroid/parathyroid disturbance) or inherited neurodegenerative conditions. Prior infection and toxic insults can also give this appearance. 2. Otherwise unremarkable head CT.  MRI brain wwo contrast 08/13/2016:   No acute intracranial abnormality. 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 This could be physiologic however given the patient's age consider other abnormalities such as Fahr's syndrome or  hyperparathyroidism.  Labs 07/02/2016:  Heavy metal screen neg, magnesium 1.8, PTH 30   Lab Results  Component Value Date   CALCIUM 9.2 11/26/2018    Lab Results  Component Value Date   NA 137 11/26/2018   K 3.7 11/26/2018   CL 107 11/26/2018   CO2 22 11/26/2018    IMPRESSION/PLAN: 1. Benign paroxysmal positional vertigo  - Continue meclizine 25mg  3-4 times daily as needed and lorazepam 0.5mg  tid prn  - Start vestibular exercises  2. Chronic migraine, stable  - Continue nortriptyline 75mg  at betime  3.  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  Seizure-free since 2017  - Continue Kepra XL 1000mg  at bedtime  Return to clinic in 3-4 months, or sooner as needed   Thank you for allowing me to participate in patient's care.  If I can answer any additional questions, I would be pleased to do so.    Sincerely,    Dallas Torok K. Jan 2018, DO

## 2020-10-17 ENCOUNTER — Other Ambulatory Visit: Payer: Self-pay

## 2020-10-17 ENCOUNTER — Ambulatory Visit (INDEPENDENT_AMBULATORY_CARE_PROVIDER_SITE_OTHER): Payer: BC Managed Care – PPO | Admitting: Neurology

## 2020-10-17 ENCOUNTER — Encounter: Payer: Self-pay | Admitting: Neurology

## 2020-10-17 VITALS — BP 144/93 | HR 107 | Ht 63.0 in | Wt 173.8 lb

## 2020-10-17 DIAGNOSIS — H8112 Benign paroxysmal vertigo, left ear: Secondary | ICD-10-CM | POA: Diagnosis not present

## 2020-10-17 DIAGNOSIS — G40909 Epilepsy, unspecified, not intractable, without status epilepticus: Secondary | ICD-10-CM

## 2020-10-17 DIAGNOSIS — G43009 Migraine without aura, not intractable, without status migrainosus: Secondary | ICD-10-CM

## 2020-10-17 NOTE — Patient Instructions (Signed)
Start vestibular therapy  You can view videos how to perform Epley Maneuver at home  How to Perform the Epley Maneuver The Epley maneuver is an exercise that relieves symptoms of vertigo. Vertigo is the feeling that you or your surroundings are moving when they are not. When you feel vertigo, you may feel like the room is spinning and may have trouble walking. The Epley maneuver is used for a type of vertigo caused by a calcium deposit in a part of the inner ear. The maneuver involves changing head positions to help the deposit move out of the area. You can do this maneuver at home whenever you have symptoms of vertigo. You can repeat it in 24 hours if your vertigo has not gone away. Even though the Epley maneuver may relieve your vertigo for a few weeks, it is possible that your symptoms will return. This maneuver relieves vertigo, but it does not relieve dizziness. What are the risks? If it is done correctly, the Epley maneuver is considered safe. Sometimes it can lead to dizziness or nausea that goes away after a short time. If you develop other symptoms--such as changes in vision, weakness, or numbness--stop doing the maneuver and call your health care provider. Supplies needed:  A bed or table.  A pillow. How to do the Epley maneuver 1. Sit on the edge of a bed or table with your back straight and your legs extended or hanging over the edge of the bed or table. 2. Turn your head halfway toward the affected ear or side as told by your health care provider. 3. Lie backward quickly with your head turned until you are lying flat on your back. You may want to position a pillow under your shoulders. 4. Hold this position for at least 30 seconds. If you feel dizzy or have symptoms of vertigo, continue to hold the position until the symptoms stop. 5. Turn your head to the opposite direction until your unaffected ear is facing the floor. 6. Hold this position for at least 30 seconds. If you feel  dizzy or have symptoms of vertigo, continue to hold the position until the symptoms stop. 7. Turn your whole body to the same side as your head so that you are positioned on your side. Your head will now be nearly facedown. Hold for at least 30 seconds. If you feel dizzy or have symptoms of vertigo, continue to hold the position until the symptoms stop. 8. Sit back up. You can repeat the maneuver in 24 hours if your vertigo does not go away.      Follow these instructions at home: For 24 hours after doing the Epley maneuver:  Keep your head in an upright position.  When lying down to sleep or rest, keep your head raised (elevated) with two or more pillows.  Avoid excessive neck movements. Activity  Do not drive or use machinery if you feel dizzy.  After doing the Epley maneuver, return to your normal activities as told by your health care provider. Ask your health care provider what activities are safe for you. General instructions  Drink enough fluid to keep your urine pale yellow.  Do not drink alcohol.  Take over-the-counter and prescription medicines only as told by your health care provider.  Keep all follow-up visits as told by your health care provider. This is important. Preventing vertigo symptoms Ask your health care provider if there is anything you should do at home to prevent vertigo. He or she may  recommend that you:  Keep your head elevated with two or more pillows while you sleep.  Do not sleep on the side of your affected ear.  Get up slowly from bed.  Avoid sudden movements during the day.  Avoid extreme head positions or movement, such as looking up or bending over. Contact a health care provider if:  Your vertigo gets worse.  You have other symptoms, including: ? Nausea. ? Vomiting. ? Headache. Get help right away if you:  Have vision changes.  Have a headache or neck pain that is severe or getting worse.  Cannot stop vomiting.  Have new  numbness or weakness in any part of your body. Summary  Vertigo is the feeling that you or your surroundings are moving when they are not.  The Epley maneuver is an exercise that relieves symptoms of vertigo.  If the Epley maneuver is done correctly, it is considered safe and relieves vertigo quickly. This information is not intended to replace advice given to you by your health care provider. Make sure you discuss any questions you have with your health care provider. Document Revised: 04/26/2019 Document Reviewed: 04/26/2019 Elsevier Patient Education  2021 [employer].

## 2020-10-27 ENCOUNTER — Other Ambulatory Visit: Payer: Self-pay | Admitting: Neurology

## 2020-10-28 ENCOUNTER — Other Ambulatory Visit: Payer: Self-pay | Admitting: Neurology

## 2020-12-06 ENCOUNTER — Ambulatory Visit: Payer: BC Managed Care – PPO | Admitting: Neurology

## 2020-12-23 ENCOUNTER — Other Ambulatory Visit: Payer: Self-pay | Admitting: Neurology

## 2021-01-17 ENCOUNTER — Other Ambulatory Visit: Payer: Self-pay

## 2021-01-17 ENCOUNTER — Ambulatory Visit (INDEPENDENT_AMBULATORY_CARE_PROVIDER_SITE_OTHER): Payer: BC Managed Care – PPO | Admitting: Neurology

## 2021-01-17 ENCOUNTER — Encounter: Payer: Self-pay | Admitting: Neurology

## 2021-01-17 VITALS — BP 121/84 | HR 106 | Ht 63.0 in | Wt 171.5 lb

## 2021-01-17 DIAGNOSIS — G238 Other specified degenerative diseases of basal ganglia: Secondary | ICD-10-CM

## 2021-01-17 DIAGNOSIS — G43009 Migraine without aura, not intractable, without status migrainosus: Secondary | ICD-10-CM | POA: Diagnosis not present

## 2021-01-17 DIAGNOSIS — G40909 Epilepsy, unspecified, not intractable, without status epilepticus: Secondary | ICD-10-CM

## 2021-01-17 MED ORDER — LEVETIRACETAM ER 500 MG PO TB24: 1000.0000 mg | ORAL_TABLET | Freq: Every day | ORAL | 3 refills | Status: DC

## 2021-01-17 MED ORDER — LEVETIRACETAM ER 1000 MG PO TB24: 1000.0000 mg | ORAL_TABLET | Freq: Every day | ORAL | 11 refills | Status: DC

## 2021-01-17 NOTE — Progress Notes (Signed)
Follow-up Visit   Date: 01/17/21    Candace Stephens MRN: [id number] DOB: June 09, 1986   Interim History: Candace Stephens is a 35 y.o. 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  She is here for follow-up visit.  Her vertigo resolved after taking meclizine, she did not need vestibular therapy.  Her headaches remain very well-controlled on nortriptyline 75mg .  She does not recall the last time she had a headache.  She is compliant with Keppra 1000mg  and remains seizure-free.  She is just got a puppy at home for her son and is very excited about this.    Medications:  Current Outpatient Medications on File Prior to Visit  Medication Sig Dispense Refill   citalopram (CELEXA) 20 MG tablet Take 30 mg by mouth daily.     levETIRAcetam (KEPPRA XR) 500 MG 24 hr tablet TAKE 2 TABLETS BY MOUTH EVERY NIGHT AT BEDTIME 60 tablet 1   LORazepam (ATIVAN) 0.5 MG tablet Take 0.5 mg by mouth 3 (three) times daily as needed.     meclizine (ANTIVERT) 25 MG tablet Take 12.5-25 mg by mouth 3 (three) times daily as needed.     nortriptyline (PAMELOR) 25 MG capsule TAKE 1 CAPSULE(25 MG) BY MOUTH AT BEDTIME 90 capsule 3   nortriptyline (PAMELOR) 50 MG capsule TAKE 1 CAPSULE(50 MG) BY MOUTH AT BEDTIME in combination with 25mg  tablet  = 75mg  at bedtime 90 capsule 3   norethindrone (CAMILA) 0.35 MG tablet Take 1 tablet (0.35 mg total) by mouth daily. (Patient not taking: Reported on 01/17/2021) 84 tablet 4   No current facility-administered medications on file prior to visit.    Allergies:  Allergies  Allergen Reactions   Hydrocodone Itching   Sumatriptan Other (See Comments)    Unknown   Topiramate Other (See Comments)    Kidney stones   Hydrocodone-Acetaminophen Itching    Vital Signs:  BP 121/84   Pulse (!) 106   Ht 5\' 3"  (1.6 m)   Wt 171 lb 8 oz (77.8 kg)   LMP 06/14/2017   SpO2 98%   BMI  30.38 kg/m   Neurological Exam: MENTAL STATUS including orientation to time, place, person, recent and remote memory, attention span and concentration, language, and fund of knowledge is normal.  Speech is not dysarthric.  CRANIAL NERVES:   Pupils are round and reactive to light.  Extraocular muscles intact bilaterally. No nystagmus. No ptosis.    MOTOR:  Motor strength is 5/5 in all extremities.  Tone is normal.   REFLEXES:  Reflexes are brisk and symmetric throughout (2+/4)  COORDINATION/GAIT:  Gait is normal.    Data: MRI lumbar spine wo contrast 04/15/2016:  No lumbar spine pathology of significance.  Greater than 4 cm RIGHT adnexal cystic lesion. Recommend pelvic Ultrasound.  MRI cervical spine wo contrast 04/15/2016:  Normal examination of the cervical spine. No post traumatic finding.  No abnormality seen to explain the presenting symptoms.  NCS/EMG of the left arm 06/30/2016:  Normal  NCS/EMG of the left arm 07/27/2017:  Normal  Routine EEG 07/01/2016:  Normal  CT head 06/29/2016: 1. Prominent bilateral basal ganglia calcification, unusual in a patient of this age. Consider evaluation for metabolic abnormalities (such as thyroid/parathyroid disturbance) or inherited neurodegenerative conditions. Prior infection and toxic insults can also give this appearance. 2. Otherwise unremarkable head CT.  MRI brain wwo contrast 08/13/2016:   No acute intracranial  abnormality. 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 This could be physiologic however given the patient's age consider other abnormalities such as Fahr's syndrome or hyperparathyroidism.  Labs 07/02/2016:  Heavy metal screen neg, magnesium 1.8, PTH 30   Lab Results  Component Value Date   CALCIUM 9.2 11/26/2018    Lab Results  Component Value Date   NA 137 11/26/2018   K 3.7 11/26/2018   CL 107 11/26/2018   CO2 22 11/26/2018     IMPRESSION/PLAN: Chronic migraines, stable - Continue nortriptyline 75mg  daily  2.   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  Seizure-free since 2017.  - Continue Keppra XL 1000mg  at bedtime - refills sent  3.  BBPV - resolved  Return to clinic in 6 months   Thank you for allowing me to participate in patient's care.  If I can answer any additional questions, I would be pleased to do so.    Sincerely,    Allie Ousley K. 2018, DO

## 2021-01-17 NOTE — Addendum Note (Signed)
Addended by: Glendale Chard on: 01/17/2021 04:02 PM   Modules accepted: Orders

## 2021-01-17 NOTE — Patient Instructions (Signed)
Great to see you today!  Continue your medications as you are taking  Return to clinic in 6 months

## 2021-06-24 ENCOUNTER — Other Ambulatory Visit: Payer: Self-pay | Admitting: Neurology

## 2021-07-18 NOTE — Progress Notes (Signed)
Follow-up Visit   Date: 07/18/21    Candace Stephens MRN: [id number] DOB: May 21, 1986   Interim History: Candace Stephens is a 36 y.o. 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  Overall, she is doing very well.  Headaches are very well-controlled on nortriptyline 75mg  daily. She does not recall the last time she has a headache.  She remains on Keppra 1000mg  and remains seizure free.  She compliant with medications. No new complaints.   Medications:  Current Outpatient Medications on File Prior to Visit  Medication Sig Dispense Refill   citalopram (CELEXA) 20 MG tablet Take 30 mg by mouth daily.     levETIRAcetam (KEPPRA XR) 500 MG 24 hr tablet Take 2 tablets (1,000 mg total) by mouth daily. 180 tablet 3   LORazepam (ATIVAN) 0.5 MG tablet Take 0.5 mg by mouth 3 (three) times daily as needed.     meclizine (ANTIVERT) 25 MG tablet Take 12.5-25 mg by mouth 3 (three) times daily as needed.     norethindrone (CAMILA) 0.35 MG tablet Take 1 tablet (0.35 mg total) by mouth daily. (Patient not taking: Reported on 01/17/2021) 84 tablet 4   nortriptyline (PAMELOR) 25 MG capsule TAKE 1 CAPSULE(25 MG) BY MOUTH AT BEDTIME 90 capsule 3   nortriptyline (PAMELOR) 50 MG capsule TAKE 1 CAPSULE BY MOUTH AT BEDTIME IN COMBINATION WITH 25MG  TABLET=75MG  AT BEDTIME 90 capsule 3   No current facility-administered medications on file prior to visit.    Allergies:  Allergies  Allergen Reactions   Hydrocodone Itching   Sumatriptan Other (See Comments)    Unknown   Topiramate Other (See Comments)    Kidney stones   Hydrocodone-Acetaminophen Itching    Vital Signs:  LMP 06/14/2017   Neurological Exam: MENTAL STATUS including orientation to time, place, person, recent and remote memory, attention span and concentration, language, and fund of knowledge is normal.  Speech is not dysarthric.  CRANIAL  NERVES:   Pupils are round and reactive to light.  Extraocular muscles intact bilaterally. No nystagmus. No ptosis.  Face is symmetric.   MOTOR:  Motor strength is 5/5 in all extremities.  Tone is normal.   COORDINATION/GAIT:  Gait is normal.    Data: MRI lumbar spine wo contrast 04/15/2016:  No lumbar spine pathology of significance.  Greater than 4 cm RIGHT adnexal cystic lesion. Recommend pelvic Ultrasound.  MRI cervical spine wo contrast 04/15/2016:  Normal examination of the cervical spine. No post traumatic finding.  No abnormality seen to explain the presenting symptoms.  NCS/EMG of the left arm 06/30/2016:  Normal  NCS/EMG of the left arm 07/27/2017:  Normal  Routine EEG 07/01/2016:  Normal  CT head 06/29/2016: 1. Prominent bilateral basal ganglia calcification, unusual in a patient of this age. Consider evaluation for metabolic abnormalities (such as thyroid/parathyroid disturbance) or inherited neurodegenerative conditions. Prior infection and toxic insults can also give this appearance. 2. Otherwise unremarkable head CT.  MRI brain wwo contrast 08/13/2016:   No acute intracranial abnormality. 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 This could be physiologic however given the patient's age consider other abnormalities such as Fahr's syndrome or hyperparathyroidism.  Labs 07/02/2016:  Heavy metal screen neg, magnesium 1.8, PTH 30   Lab Results  Component Value Date   CALCIUM 9.2 11/26/2018    Lab Results  Component Value Date   NA 137 11/26/2018   K 3.7 11/26/2018   CL 107 11/26/2018   CO2 22 11/26/2018    IMPRESSION/PLAN: Chronic migraine, stable  - Continue nortriptyline 75mg  daily  2.   Generalized seizure disorder in the setting ofFarh's disease (diagnosed 06/2016), stable. Seizure-free since 2017.  - Continue Keppra XL 1000mg  at bedtime  3.  BBPV -  resolved  Return to clinic in 1 year   Thank you for allowing me to participate in patient's care.  If I can answer any additional questions, I would be pleased to do so.    Sincerely,    Harpreet Signore K. 2018, DO

## 2021-07-21 ENCOUNTER — Other Ambulatory Visit: Payer: Self-pay

## 2021-07-21 ENCOUNTER — Encounter: Payer: Self-pay | Admitting: Neurology

## 2021-07-21 ENCOUNTER — Ambulatory Visit (INDEPENDENT_AMBULATORY_CARE_PROVIDER_SITE_OTHER): Payer: BC Managed Care – PPO | Admitting: Neurology

## 2021-07-21 VITALS — BP 127/81 | HR 113 | Ht 63.0 in | Wt 165.0 lb

## 2021-07-21 DIAGNOSIS — G40909 Epilepsy, unspecified, not intractable, without status epilepticus: Secondary | ICD-10-CM | POA: Diagnosis not present

## 2021-07-21 DIAGNOSIS — G238 Other specified degenerative diseases of basal ganglia: Secondary | ICD-10-CM

## 2021-07-21 DIAGNOSIS — G43009 Migraine without aura, not intractable, without status migrainosus: Secondary | ICD-10-CM

## 2021-07-21 MED ORDER — LEVETIRACETAM ER 500 MG PO TB24: 1000.0000 mg | ORAL_TABLET | Freq: Every day | ORAL | 3 refills | Status: DC

## 2021-07-21 NOTE — Patient Instructions (Signed)
Return to clinic in 1 year.

## 2021-10-19 ENCOUNTER — Other Ambulatory Visit: Payer: Self-pay

## 2021-10-19 ENCOUNTER — Emergency Department (HOSPITAL_BASED_OUTPATIENT_CLINIC_OR_DEPARTMENT_OTHER)
Admission: EM | Admit: 2021-10-19 | Discharge: 2021-10-19 | Disposition: A | Discharge status: Home / Self Care | Payer: BC Managed Care – PPO | Attending: Emergency Medicine | Admitting: Emergency Medicine

## 2021-10-19 ENCOUNTER — Encounter (HOSPITAL_BASED_OUTPATIENT_CLINIC_OR_DEPARTMENT_OTHER): Payer: Self-pay

## 2021-10-19 DIAGNOSIS — J069 Acute upper respiratory infection, unspecified: Secondary | ICD-10-CM | POA: Diagnosis not present

## 2021-10-19 DIAGNOSIS — Z20822 Contact with and (suspected) exposure to covid-19: Secondary | ICD-10-CM | POA: Diagnosis not present

## 2021-10-19 DIAGNOSIS — R059 Cough, unspecified: Secondary | ICD-10-CM | POA: Diagnosis present

## 2021-10-19 DIAGNOSIS — J4521 Mild intermittent asthma with (acute) exacerbation: Secondary | ICD-10-CM | POA: Insufficient documentation

## 2021-10-19 LAB — GROUP A STREP BY PCR: Group A Strep by PCR: NOT DETECTED

## 2021-10-19 LAB — RESP PANEL BY RT-PCR (FLU A&B, COVID) ARPGX2
Influenza A by PCR: NEGATIVE
Influenza B by PCR: NEGATIVE
SARS Coronavirus 2 by RT PCR: NEGATIVE

## 2021-10-19 MED ORDER — PREDNISONE 50 MG PO TABS: 50.0000 mg | ORAL_TABLET | Freq: Every day | ORAL | 0 refills | Status: DC

## 2021-10-19 MED ORDER — BENZONATATE 100 MG PO CAPS: 100.0000 mg | ORAL_CAPSULE | Freq: Three times a day (TID) | ORAL | 0 refills | Status: DC

## 2021-10-19 MED ORDER — IPRATROPIUM-ALBUTEROL 0.5-2.5 (3) MG/3ML IN SOLN
RESPIRATORY_TRACT | Status: DC
Start: 2021-10-19 — End: 2021-10-19
  Filled 2021-10-19: qty 3

## 2021-10-19 MED ORDER — BENZONATATE 100 MG PO CAPS
200.0000 mg | ORAL_CAPSULE | Freq: Once | ORAL | Status: AC
  Administered 2021-10-19: 200 mg via ORAL
  Filled 2021-10-19: qty 2

## 2021-10-19 MED ORDER — PREDNISONE 50 MG PO TABS
60.0000 mg | ORAL_TABLET | Freq: Once | ORAL | Status: AC
  Administered 2021-10-19: 60 mg via ORAL
  Filled 2021-10-19: qty 1

## 2021-10-19 MED ORDER — IPRATROPIUM-ALBUTEROL 0.5-2.5 (3) MG/3ML IN SOLN
3.0000 mL | Freq: Once | RESPIRATORY_TRACT | Status: AC
  Administered 2021-10-19: 3 mL via RESPIRATORY_TRACT

## 2021-10-19 NOTE — ED Triage Notes (Signed)
Patient here POV from Home with Cough. ? ?Endorses Cough for Two Days. Worsening since it began. Associated with Congestion, Aches. ? ?No Known Fevers. Uses Inhaler with No Relief. ? ?NAD Noted during Triage. A&Ox4. GCS 15. Ambulatory. ?

## 2021-10-19 NOTE — ED Provider Notes (Signed)
?[facility]PT ?Provider Note ? ? ?CSN: [id number] ?Arrival date & time: 10/19/21  1104 ? ?  ? ?History ? ?Chief Complaint  ?Patient presents with  ? Cough  ? ? ?EDLA PARA is a 36 y.o. female. ? ? ?Cough ?Associated symptoms: no fever   ? ?Patient has a history of asthma.  She presents to the ED with complaints of cough and congestion.  She is also had some body aches.  Patient's family members recently had strep throat.  She does not have a sore throat herself.  She has not had any fevers.  She has not had any vomiting or diarrhea.  Patient does feel like she has had to use her inhaler more without any significant relief prior to my evaluation patient was given a breathing treatment she does feel better after that ? ?Home Medications ?Prior to Admission medications   ?Medication Sig Start Date End Date Taking? Authorizing Provider  ?benzonatate (TESSALON) 100 MG capsule Take 1 capsule (100 mg total) by mouth every 8 (eight) hours. 10/19/21  Yes Linwood Dibbles, MD  ?predniSONE (DELTASONE) 50 MG tablet Take 1 tablet (50 mg total) by mouth daily. 10/19/21  Yes Linwood Dibbles, MD  ?levETIRAcetam (KEPPRA XR) 500 MG 24 hr tablet Take 2 tablets (1,000 mg total) by mouth daily. 07/21/21   Nita Sickle K, DO  ?meclizine (ANTIVERT) 25 MG tablet Take 12.5-25 mg by mouth 3 (three) times daily as needed. 08/28/19   [provider]  ?nortriptyline (PAMELOR) 25 MG capsule TAKE 1 CAPSULE(25 MG) BY MOUTH AT BEDTIME 06/24/21   Nita Sickle K, DO  ?nortriptyline (PAMELOR) 50 MG capsule TAKE 1 CAPSULE BY MOUTH AT BEDTIME IN COMBINATION WITH 25MG  TABLET=75MG  AT BEDTIME 06/24/21   06/26/21 K, DO  ?   ? ?Allergies    ?Hydrocodone, Sumatriptan, Topiramate, and Hydrocodone-acetaminophen   ? ?Review of Systems   ?Review of Systems  ?Constitutional:  Negative for fever.  ?Respiratory:  Positive for cough.   ? ?Physical Exam ?Updated Vital Signs ?BP (!) 126/100 (BP Location: Right Arm)   Pulse (!) 110   Temp  98.2 ?F (36.8 ?C) (Oral)   Resp 16   Ht 1.6 m (5\' 3" )   Wt 74.8 kg   LMP 06/14/2017   SpO2 99%   BMI 29.21 kg/m?  ?Physical Exam ?Vitals and nursing note reviewed.  ?Constitutional:   ?   General: She is not in acute distress. ?   Appearance: She is well-developed.  ?HENT:  ?   Head: Normocephalic and atraumatic.  ?   Right Ear: External ear normal.  ?   Left Ear: External ear normal.  ?   Mouth/Throat:  ?   Mouth: Mucous membranes are moist.  ?   Pharynx: Oropharynx is clear. No oropharyngeal exudate or posterior oropharyngeal erythema.  ?Eyes:  ?   General: No scleral icterus.    ?   Right eye: No discharge.     ?   Left eye: No discharge.  ?   Conjunctiva/sclera: Conjunctivae normal.  ?Neck:  ?   Trachea: No tracheal deviation.  ?Cardiovascular:  ?   Rate and Rhythm: Normal rate and regular rhythm.  ?Pulmonary:  ?   Effort: Pulmonary effort is normal. No respiratory distress.  ?   Breath sounds: Normal breath sounds. No stridor. No wheezing or rales.  ?Abdominal:  ?   General: Bowel sounds are normal. There is no distension.  ?   Palpations: Abdomen is soft.  ?  Tenderness: There is no abdominal tenderness. There is no guarding or rebound.  ?Musculoskeletal:     ?   General: No tenderness or deformity.  ?   Cervical back: Neck supple.  ?Skin: ?   General: Skin is warm and dry.  ?   Findings: No rash.  ?Neurological:  ?   General: No focal deficit present.  ?   Mental Status: She is alert.  ?   Cranial Nerves: No cranial nerve deficit (no facial droop, extraocular movements intact, no slurred speech).  ?   Sensory: No sensory deficit.  ?   Motor: No abnormal muscle tone or seizure activity.  ?   Coordination: Coordination normal.  ?Psychiatric:     ?   Mood and Affect: Mood normal.  ? ? ?ED Results / Procedures / Treatments   ?Labs ?(all labs ordered are listed, but only abnormal results are displayed) ?Labs Reviewed  ?RESP PANEL BY RT-PCR (FLU A&B, COVID) ARPGX2  ?GROUP A STREP BY PCR   ? ? ?EKG ?None ? ?Radiology ?No results found. ? ?Procedures ?Procedures  ? ? ?Medications Ordered in ED ?Medications  ?predniSONE (DELTASONE) tablet 60 mg (has no administration in time range)  ?benzonatate (TESSALON) capsule 200 mg (has no administration in time range)  ?ipratropium-albuterol (DUONEB) 0.5-2.5 (3) MG/3ML nebulizer solution 3 mL (3 mLs Nebulization Given 10/19/21 1352)  ? ? ?ED Course/ Medical Decision Making/ A&P ?  ?                        ?Medical Decision Making ?Risk ?Prescription drug management. ? ? ?Patient's lungs are clear.  I doubt pneumonia.  Strep flu and COVID test were negative.  Suspect viral etiology.  Patient was given a breathing treatment prior to my exam and she noted improvement.  Suspect she is having a component of bronchospasm.  Will discharge home with course of steroids.  Tessalon for cough.  Recommend follow-up with PCP if symptoms not resolved by the end of the week.  Return to the ED for worsening symptoms fevers. ? ? ? ? ? ? ? ?Final Clinical Impression(s) / ED Diagnoses ?Final diagnoses:  ?Upper respiratory tract infection, unspecified type  ?Mild intermittent asthma with exacerbation  ? ? ?Rx / DC Orders ?ED Discharge Orders   ? ?      Ordered  ?  predniSONE (DELTASONE) 50 MG tablet  Daily       ? 10/19/21 1425  ?  benzonatate (TESSALON) 100 MG capsule  Every 8 hours       ? 10/19/21 1425  ? ?  ?  ? ?  ? ? ?  ?Linwood Dibbles, MD ?10/19/21 1427 ? ?

## 2021-10-19 NOTE — ED Notes (Signed)
Pt. given X1 Duoneb per RT Asthma Wheeze Protocol, Dr. Lynelle Doctor made aware, has Albuterol Inhaler that she has been using regularly with little improvement, given Spacer device to use with existing Inh. to better improve Med. delivery/results and educated on use. ?

## 2021-10-19 NOTE — Discharge Instructions (Addendum)
Your strep covid and flu test today were negative.  Take the medications as prescribed to help with the cough and congestion.  Return to the ER for worsening symptoms.  Follow-up with your doctor if symptoms have not resolved by the end of the week ?

## 2022-01-10 ENCOUNTER — Other Ambulatory Visit: Payer: Self-pay | Admitting: Neurology

## 2022-06-14 ENCOUNTER — Other Ambulatory Visit: Payer: Self-pay | Admitting: Neurology

## 2022-06-18 ENCOUNTER — Other Ambulatory Visit: Payer: Self-pay | Admitting: Neurology

## 2022-07-18 ENCOUNTER — Other Ambulatory Visit: Payer: Self-pay | Admitting: Neurology

## 2022-07-24 ENCOUNTER — Ambulatory Visit (INDEPENDENT_AMBULATORY_CARE_PROVIDER_SITE_OTHER): Payer: BC Managed Care – PPO | Admitting: Neurology

## 2022-07-24 ENCOUNTER — Encounter: Payer: Self-pay | Admitting: Neurology

## 2022-07-24 VITALS — BP 138/89 | HR 105 | Ht 63.0 in | Wt 160.0 lb

## 2022-07-24 DIAGNOSIS — G43009 Migraine without aura, not intractable, without status migrainosus: Secondary | ICD-10-CM | POA: Diagnosis not present

## 2022-07-24 DIAGNOSIS — G40909 Epilepsy, unspecified, not intractable, without status epilepticus: Secondary | ICD-10-CM | POA: Diagnosis not present

## 2022-07-24 MED ORDER — LEVETIRACETAM ER 500 MG PO TB24: 1000.0000 mg | ORAL_TABLET | Freq: Every day | ORAL | 3 refills | Status: DC

## 2022-07-24 MED ORDER — NORTRIPTYLINE HCL 50 MG PO CAPS: ORAL_CAPSULE | ORAL | 3 refills | Status: DC

## 2022-07-24 NOTE — Patient Instructions (Addendum)
Happy Birthday!!!  Reduce nortriptyline to 50mg  at bedtime  I'll see you again in 1 year

## 2022-07-24 NOTE — Progress Notes (Signed)
Follow-up Visit   Date: 07/24/22    Candace Stephens MRN: [id number] DOB: February 07, 1986   Interim History: Candace Stephens is a 37 y.o. 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  IMPRESSION/PLAN: Chronic migraine, stable for several years.  Discussed tapering medication, which she is in agreement with - Reduce nortriptyline to 50mg  at bedtime - If headaches get worse, ok to go back to 75mg  at bedtime  2.  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  Seizure-free since 2017.  - Continue Keppra XL 1000mg  at bedtime  3.  BBPV - resolved  Return to clinic in 1 year   ---------------------------------------------------------------------- UPDATE 07/24/2022:  She is here for 1 year visit.  She is doing great and has not had migraine in over a year.  She is tolerating nortriptyline 75mg  bedtime.  She remains on Keppra 1000mg  and remains seizure-free.  No new complaints.  She will be celebrating her birthday tomorrow.   Medications:  Current Outpatient Medications on File Prior to Visit  Medication Sig Dispense Refill   levETIRAcetam (KEPPRA XR) 500 MG 24 hr tablet Take 2 tablets (1,000 mg total) by mouth daily. 180 tablet 3   meclizine (ANTIVERT) 25 MG tablet Take 12.5-25 mg by mouth 3 (three) times daily as needed.     nortriptyline (PAMELOR) 25 MG capsule TAKE 1 CAPSULE(25 MG) BY MOUTH AT BEDTIME 30 capsule 0   nortriptyline (PAMELOR) 50 MG capsule TAKE 1 CAPSULE BY MOUTH AT BEDTIME IN COMBINATION WITH 25MG  TABLET=75MG  AT BEDTIME 90 capsule 3   No current facility-administered medications on file prior to visit.    Allergies:  Allergies  Allergen Reactions   Hydrocodone Itching   Sumatriptan Other (See Comments)    Unknown   Topiramate Other (See Comments)    Kidney stones   Hydrocodone-Acetaminophen Itching    Vital  Signs:  BP 138/89   Pulse (!) 105   Ht 5\' 3"  (1.6 m)   Wt 160 lb (72.6 kg)   LMP 06/14/2017   SpO2 98%   BMI 28.34 kg/m   Neurological Exam: MENTAL STATUS including orientation to time, place, person, recent and remote memory, attention span and concentration, language, and fund of knowledge is normal.  Speech is not dysarthric.  CRANIAL NERVES:   Pupils are round and reactive to light.  Extraocular muscles intact bilaterally. No nystagmus. No ptosis.  Face is symmetric.   MOTOR:  Motor strength is 5/5 in all extremities.  Tone is normal.   COORDINATION/GAIT:  Gait is normal.    Data: MRI lumbar spine wo contrast 04/15/2016:  No lumbar spine pathology of significance.  Greater than 4 cm RIGHT adnexal cystic lesion. Recommend pelvic Ultrasound.  MRI cervical spine wo contrast 04/15/2016:  Normal examination of the cervical spine. No post traumatic finding.  No abnormality seen to explain the presenting symptoms.  NCS/EMG of the left arm 06/30/2016:  Normal  NCS/EMG of the left arm 07/27/2017:  Normal  Routine EEG 07/01/2016:  Normal  CT head 06/29/2016: 1. Prominent bilateral basal ganglia calcification, unusual in a patient of this age. Consider evaluation for metabolic abnormalities (such as thyroid/parathyroid disturbance) or inherited neurodegenerative conditions. Prior infection and toxic insults can also give this appearance. 2. Otherwise unremarkable head CT.  MRI brain wwo contrast 08/13/2016:   No acute intracranial abnormality. 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 This could be physiologic however given the patient's age consider other abnormalities such as Fahr's syndrome or hyperparathyroidism.  Labs 07/02/2016:  Heavy metal screen neg, magnesium 1.8, PTH 30   Lab Results  Component Value Date   CALCIUM 9.2 11/26/2018    Lab Results  Component Value Date   NA 137  11/26/2018   K 3.7 11/26/2018   CL 107 11/26/2018   CO2 22 11/26/2018     Thank you for allowing me to participate in patient's care.  If I can answer any additional questions, I would be pleased to do so.    Sincerely,    Chirstine Defrain K. Posey Pronto, DO

## 2022-08-19 ENCOUNTER — Other Ambulatory Visit: Payer: Self-pay | Admitting: Neurology

## 2022-09-28 ENCOUNTER — Encounter: Payer: Self-pay | Admitting: Neurology

## 2022-09-28 ENCOUNTER — Other Ambulatory Visit: Payer: Self-pay | Admitting: Neurology

## 2022-09-30 ENCOUNTER — Other Ambulatory Visit: Payer: Self-pay

## 2022-09-30 MED ORDER — NORTRIPTYLINE HCL 25 MG PO CAPS: ORAL_CAPSULE | ORAL | 1 refills | Status: DC

## 2022-09-30 MED ORDER — NORTRIPTYLINE HCL 25 MG PO CAPS
ORAL_CAPSULE | ORAL | 0 refills | Status: DC
Start: 2022-09-30 — End: 2022-09-30

## 2022-09-30 MED ORDER — NORTRIPTYLINE HCL 25 MG PO CAPS
ORAL_CAPSULE | ORAL | 3 refills | Status: DC
Start: 2022-09-30 — End: 2023-07-26

## 2022-10-07 ENCOUNTER — Other Ambulatory Visit: Payer: Self-pay | Admitting: Neurology

## 2023-07-20 ENCOUNTER — Other Ambulatory Visit: Payer: Self-pay | Admitting: Neurology

## 2023-07-24 ENCOUNTER — Other Ambulatory Visit: Payer: Self-pay | Admitting: Neurology

## 2023-07-26 ENCOUNTER — Encounter: Payer: Self-pay | Admitting: Neurology

## 2023-07-26 ENCOUNTER — Ambulatory Visit: Payer: 59 | Admitting: Neurology

## 2023-07-26 VITALS — BP 132/85 | HR 104 | Ht 63.0 in | Wt 162.0 lb

## 2023-07-26 DIAGNOSIS — G40909 Epilepsy, unspecified, not intractable, without status epilepticus: Secondary | ICD-10-CM

## 2023-07-26 DIAGNOSIS — G43009 Migraine without aura, not intractable, without status migrainosus: Secondary | ICD-10-CM

## 2023-07-26 MED ORDER — NORTRIPTYLINE HCL 25 MG PO CAPS: ORAL_CAPSULE | ORAL | 3 refills | Status: DC

## 2023-07-26 MED ORDER — LEVETIRACETAM ER 500 MG PO TB24: 1000.0000 mg | ORAL_TABLET | Freq: Every day | ORAL | 3 refills | Status: DC

## 2023-07-26 NOTE — Progress Notes (Signed)
 Follow-up Visit   Date: 07/26/23    Candace Stephens MRN: [id number] DOB: 10/27/1985   Interim History: Candace Stephens is a 38 y.o. 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  IMPRESSION/PLAN: Chronic migraine, stable.  She did not tolerate lower dose of nortriptyline .  - Continue nortriptyline  75mg  at bedtime  2.  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  Seizure-free since 2017.  - Continue Keppra  XL 1000mg  at bedtime  3.  BBPV - resolved  Return to clinic in 1 year   ---------------------------------------------------------------------- UPDATE 07/24/2022:  She is here for 1 year visit.  She is doing great and has not had migraine in over a year.  She is tolerating nortriptyline  75mg  bedtime.  She remains on Keppra  1000mg  and remains seizure-free.  No new complaints.  She will be celebrating her birthday tomorrow.  UPDATE 07/26/2023:  She is here for 1 year visit. At her last visit, we attempted to reduce nortriptyline  to 50mg  at bedtime, however, she began having worsening headaches.  She went back to taking nortriptyline  75mg  at bedtime and headaches are much better controlled.  No interval seizures.  Overall, she is doing really well.    Medications:  Current Outpatient Medications on File Prior to Visit  Medication Sig Dispense Refill   levETIRAcetam  (KEPPRA  XR) 500 MG 24 hr tablet Take 2 tablets (1,000 mg total) by mouth daily. 180 tablet 3   meclizine  (ANTIVERT ) 25 MG tablet Take 12.5-25 mg by mouth 3 (three) times daily as needed. PRN     nortriptyline  (PAMELOR ) 25 MG capsule TAKE 1 CAPSULE(25 MG) BY MOUTH AT BEDTIME Strength: 25 mg 90 capsule 3   nortriptyline  (PAMELOR ) 50 MG capsule TAKE 1 CAPSULE BY MOUTH AT BEDTIME 90 capsule 3   No current facility-administered medications on file prior to visit.     Allergies:  Allergies  Allergen Reactions   Hydrocodone Itching   Sumatriptan  Other (See Comments)    Unknown   Topiramate  Other (See Comments)    Kidney stones   Hydrocodone-Acetaminophen  Itching    Vital Signs:  BP 132/85   Pulse (!) 104   Ht 5' 3 (1.6 m)   Wt 162 lb (73.5 kg)   LMP 06/14/2017   SpO2 100%   BMI 28.70 kg/m   Neurological Exam: MENTAL STATUS including orientation to time, place, person, recent and remote memory, attention span and concentration, language, and fund of knowledge is normal.  Speech is not dysarthric.  CRANIAL NERVES:   Pupils are round and reactive to light.  Extraocular muscles intact bilaterally. No nystagmus. No ptosis.  Face is symmetric.   MOTOR:  Motor strength is 5/5 in all extremities.  Tone is normal.   COORDINATION/GAIT:  Gait is normal.    Data: MRI lumbar spine wo contrast 04/15/2016:  No lumbar spine pathology of significance.  Greater than 4 cm RIGHT adnexal cystic lesion. Recommend pelvic Ultrasound.  MRI cervical spine wo contrast 04/15/2016:  Normal examination of the cervical spine. No post traumatic finding.  No abnormality seen to explain the presenting symptoms.  NCS/EMG of the left arm 06/30/2016:  Normal  NCS/EMG of the left arm 07/27/2017:  Normal  Routine EEG 07/01/2016:  Normal  CT head 06/29/2016: 1. Prominent bilateral basal ganglia calcification, unusual in a patient of this age. Consider evaluation for metabolic abnormalities (such as thyroid/parathyroid  disturbance) or inherited  neurodegenerative conditions. Prior infection and toxic insults can also give this appearance. 2. Otherwise unremarkable head CT.  MRI brain wwo contrast 08/13/2016:   No acute intracranial abnormality. 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 This could be physiologic however given the patient's age consider other abnormalities such as  Fahr's syndrome or hyperparathyroidism.  Labs 07/02/2016:  Heavy metal screen neg, magnesium 1.8, PTH 30   Lab Results  Component Value Date   CALCIUM 9.2 11/26/2018    Lab Results  Component Value Date   NA 137 11/26/2018   K 3.7 11/26/2018   CL 107 11/26/2018   CO2 22 11/26/2018     Thank you for allowing me to participate in patient's care.  If I can answer any additional questions, I would be pleased to do so.    Sincerely,    Tonee Silverstein K. Tobie, DO

## 2023-09-24 ENCOUNTER — Other Ambulatory Visit: Payer: Self-pay | Admitting: Neurology

## 2024-05-15 ENCOUNTER — Encounter: Payer: Self-pay | Admitting: Neurology

## 2024-07-25 ENCOUNTER — Encounter: Payer: Self-pay | Admitting: Neurology

## 2024-07-25 ENCOUNTER — Ambulatory Visit (INDEPENDENT_AMBULATORY_CARE_PROVIDER_SITE_OTHER): Admitting: Neurology

## 2024-07-25 VITALS — BP 137/88 | HR 117 | Ht 63.0 in | Wt 167.0 lb

## 2024-07-25 DIAGNOSIS — G43009 Migraine without aura, not intractable, without status migrainosus: Secondary | ICD-10-CM | POA: Diagnosis not present

## 2024-07-25 DIAGNOSIS — G40909 Epilepsy, unspecified, not intractable, without status epilepticus: Secondary | ICD-10-CM | POA: Diagnosis not present

## 2024-07-25 MED ORDER — NORTRIPTYLINE HCL 25 MG PO CAPS: ORAL_CAPSULE | ORAL | 3 refills | Status: AC

## 2024-07-25 MED ORDER — LEVETIRACETAM ER 500 MG PO TB24: 1000.0000 mg | ORAL_TABLET | Freq: Every day | ORAL | 3 refills | Status: AC

## 2024-07-25 MED ORDER — NORTRIPTYLINE HCL 50 MG PO CAPS: 50.0000 mg | ORAL_CAPSULE | Freq: Every day | ORAL | 3 refills | Status: AC

## 2024-07-25 NOTE — Progress Notes (Signed)
 "   Follow-up Visit   Date: 07/25/2024    Candace Stephens MRN: [id number] DOB: 1986/03/20   Interim History: Candace Stephens is a 39 y.o. 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  IMPRESSION/PLAN: Assessment & Plan Chronic migraine Migraines controlled with nortriptyline . Caffeine withdrawal may trigger headaches. - Continue nortriptyline  75 mg at bedtime. - Refilled nortriptyline  prescription.  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 - Continue levetiracetam  (Keppra  XR) 1000 mg daily. - Refilled levetiracetam  prescription.   Return to clinic in 1 year   ---------------------------------------------------------------------- UPDATE 07/25/2024:  Discussed the use of AI scribe software for clinical note transcription with the patient, who gave verbal consent to proceed.  History of Present Illness Candace Stephens is a 39 year old female with migraines who presents for medication refills and follow-up.  It [birth year] her birthday today.   Her headaches are well-controlled. She attempted to stop drinking coffee but experienced severe headaches that progressed to migraines, lasting only two days without coffee before resuming her usual intake of 1 cup. She does not experience headaches when she maintains her coffee consumption.  She is currently taking nortriptyline  75 mg for her headaches. A previous attempt to reduce the dose was unsuccessful. She is also on Keppra , with no reported issues.  She occasionally experiences lightheadedness, particularly when standing up too quickly, but manages this by bracing herself and ensuring her feet are planted. No falls have occurred, and these episodes are infrequent.  No falls have occurred, and episodes of lightheadedness are infrequent.   Medications:  Current Outpatient  Medications on File Prior to Visit  Medication Sig Dispense Refill   levETIRAcetam  (KEPPRA  XR) 500 MG 24 hr tablet Take 2 tablets (1,000 mg total) by mouth daily. 180 tablet 3   meclizine  (ANTIVERT ) 25 MG tablet Take 12.5-25 mg by mouth 3 (three) times daily as needed. PRN     nortriptyline  (PAMELOR ) 25 MG capsule TAKE 1 CAPSULE(25 MG) BY MOUTH AT BEDTIME Strength: 25 mg 90 capsule 3   nortriptyline  (PAMELOR ) 50 MG capsule TAKE 1 CAPSULE BY MOUTH AT BEDTIME 90 capsule 3   No current facility-administered medications on file prior to visit.    Allergies:  Allergies  Allergen Reactions   Hydrocodone Itching   Sumatriptan  Other (See Comments)    Unknown   Topiramate  Other (See Comments)    Kidney stones   Hydrocodone-Acetaminophen  Itching    Vital Signs:  BP 137/88   Pulse (!) 117   Ht 5' 3 (1.6 m)   Wt 167 lb (75.8 kg)   LMP 06/14/2017   SpO2 100%   BMI 29.58 kg/m   Neurological Exam: MENTAL STATUS including orientation to time, place, person, recent and remote memory, attention span and concentration, language, and fund of knowledge is normal.  Speech is not dysarthric.  CRANIAL NERVES:   Pupils are round and reactive to light.  Extraocular muscles intact bilaterally. No nystagmus. No ptosis.  Face is symmetric.   MOTOR:  Motor strength is 5/5 in all extremities.  Tone is normal.   COORDINATION/GAIT:  Gait is normal.    Data: MRI lumbar spine wo contrast 04/15/2016:  No lumbar spine pathology of significance.  Greater than 4 cm RIGHT adnexal cystic lesion. Recommend pelvic Ultrasound.  MRI cervical spine wo contrast 04/15/2016:  Normal examination of the cervical spine. No post traumatic finding.  No abnormality seen to explain the presenting symptoms.  NCS/EMG of the left arm 06/30/2016:  Normal  NCS/EMG of the left arm 07/27/2017:  Normal  Routine EEG 07/01/2016:  Normal  CT head 06/29/2016: 1. Prominent bilateral basal ganglia calcification, unusual in a patient of  this age. Consider evaluation for metabolic abnormalities (such as thyroid/parathyroid  disturbance) or inherited neurodegenerative conditions. Prior infection and toxic insults can also give this appearance. 2. Otherwise unremarkable head CT.  MRI brain wwo contrast 08/13/2016:   No acute intracranial abnormality. 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 This could be physiologic however given the patient's age consider other abnormalities such as Fahr's syndrome or hyperparathyroidism.  Labs 07/02/2016:  Heavy metal screen neg, magnesium 1.8, PTH 30   Lab Results  Component Value Date   CALCIUM 9.2 11/26/2018    Lab Results  Component Value Date   NA 137 11/26/2018   K 3.7 11/26/2018   CL 107 11/26/2018   CO2 22 11/26/2018     Thank you for allowing me to participate in patient's care.  If I can answer any additional questions, I would be pleased to do so.    Sincerely,    Tennyson Kallen K. Tobie, DO  "

## 2024-07-25 NOTE — Patient Instructions (Signed)
Happy birthday!!

## 2024-07-31 ENCOUNTER — Ambulatory Visit: Payer: 59 | Admitting: Neurology

## 2024-08-16 ENCOUNTER — Other Ambulatory Visit: Payer: Self-pay | Admitting: Neurology

## 2025-07-30 ENCOUNTER — Ambulatory Visit: Payer: Self-pay | Admitting: Neurology
